# Patient Record
Sex: Female | Born: 1990 | Race: White | Hispanic: No | Marital: Single
Health system: Midwestern US, Academic
[De-identification: ages and names within clinical notes are randomized; demographics above are authoritative.]

---

## 2017-09-05 ENCOUNTER — Encounter: Admit: 2017-09-05 | Discharge: 2017-09-05

## 2017-09-05 ENCOUNTER — Encounter: Admit: 2017-09-05 | Discharge: 2017-09-06

## 2017-09-05 LAB — BLOOD GASES, PERIPHERAL VENOUS
Lab: 19 MMOL/L
Lab: 34 mmHg — ABNORMAL LOW (ref 36–50)
Lab: 5.6 MMOL/L
Lab: 65 mmHg — ABNORMAL HIGH (ref 33–48)
Lab: 7.3 (ref 7.30–7.40)
Lab: 93 % — ABNORMAL HIGH (ref 55–71)

## 2017-09-05 LAB — RVP VIRAL PANEL PCR: Lab: DETECTED

## 2017-09-05 LAB — POC GLUCOSE
Lab: 133 mg/dL — ABNORMAL HIGH (ref 70–100)
Lab: 143 mg/dL — ABNORMAL HIGH (ref 70–100)
Lab: 165 mg/dL — ABNORMAL HIGH (ref 70–100)
Lab: 168 mg/dL — ABNORMAL HIGH (ref 70–100)
Lab: 179 mg/dL — ABNORMAL HIGH (ref 70–100)
Lab: 224 mg/dL — ABNORMAL HIGH (ref 70–100)
Lab: 258 mg/dL — ABNORMAL HIGH (ref 70–100)
Lab: 265 mg/dL — ABNORMAL HIGH (ref 70–100)
Lab: 273 mg/dL — ABNORMAL HIGH (ref 70–100)
Lab: 331 mg/dL — ABNORMAL HIGH (ref 70–100)
Lab: 75 mg/dL (ref 70–100)

## 2017-09-05 LAB — PHENCYCLIDINES-URINE RANDOM: Lab: NEGATIVE

## 2017-09-05 LAB — BASIC METABOLIC PANEL
Lab: 133 MMOL/L — ABNORMAL LOW (ref 137–147)
Lab: 101 MMOL/L (ref 98–110)
Lab: 132 MMOL/L — ABNORMAL LOW (ref 137–147)
Lab: 4.1 MMOL/L (ref 3.5–5.1)

## 2017-09-05 LAB — BENZODIAZEPINES-URINE RANDOM: Lab: NEGATIVE

## 2017-09-05 LAB — PHOSPHORUS
Lab: 1.8 mg/dL — ABNORMAL LOW (ref 2.0–4.5)
Lab: 2.1 mg/dL (ref 2.0–4.5)

## 2017-09-05 LAB — BARBITURATES-URINE RANDOM: Lab: NEGATIVE

## 2017-09-05 LAB — CBC
Lab: 13 % (ref 11–15)
Lab: 13 g/dL (ref 12.0–15.0)
Lab: 232 10*3/uL (ref 150–400)
Lab: 30 pg (ref 26–34)
Lab: 33 g/dL (ref 32.0–36.0)
Lab: 4.4 M/UL (ref 4.0–5.0)
Lab: 40 % (ref 36–45)
Lab: 7.3 10*3/uL (ref 4.5–11.0)
Lab: 7.7 FL (ref 7–11)

## 2017-09-05 LAB — URINALYSIS DIPSTICK: Lab: 1 (ref 1.003–1.035)

## 2017-09-05 LAB — HEMOGLOBIN A1C: Lab: 18 % — ABNORMAL HIGH (ref 4.0–6.0)

## 2017-09-05 LAB — BETA HYDROXYBUTYRATE (KETONES): Lab: 3.3 MMOL/L — ABNORMAL HIGH (ref <0.3)

## 2017-09-05 LAB — COMPREHENSIVE METABOLIC PANEL
Lab: 0.3 mg/dL (ref 0.3–1.2)
Lab: 0.6 mg/dL (ref 0.4–1.00)
Lab: 107 MMOL/L (ref 98–110)
Lab: 138 MMOL/L (ref 137–147)
Lab: 15 mg/dL (ref 7–25)
Lab: 214 mg/dL — ABNORMAL HIGH (ref 70–100)
Lab: 25 U/L (ref 7–40)
Lab: 6.5 g/dL (ref 6.0–8.0)

## 2017-09-05 LAB — PROCALCITONIN: Lab: 0.3 ng/mL — ABNORMAL HIGH (ref <0.10)

## 2017-09-05 LAB — AMPHETAMINES-URINE RANDOM: Lab: NEGATIVE

## 2017-09-05 LAB — OPIATES-URINE RANDOM: Lab: NEGATIVE

## 2017-09-05 LAB — PREGNANCY TEST-URINE
Lab: 1
Lab: NEGATIVE

## 2017-09-05 LAB — CANNABINOIDS-URINE RANDOM: Lab: NEGATIVE

## 2017-09-05 LAB — MAGNESIUM
Lab: 1.8 mg/dL (ref 1.6–2.6)
Lab: 2.3 mg/dL (ref 1.6–2.6)

## 2017-09-05 LAB — GGTP: Lab: 19 U/L — ABNORMAL HIGH (ref <100)

## 2017-09-05 LAB — COCAINE-URINE RANDOM: Lab: NEGATIVE

## 2017-09-05 LAB — URINALYSIS, MICROSCOPIC

## 2017-09-05 LAB — TROPONIN-I: Lab: 0 ng/mL — ABNORMAL HIGH (ref 0.0–0.05)

## 2017-09-05 MED ORDER — INSULIN ASPART 100 UNIT/ML SC FLEXPEN
0-14 [IU] | Freq: Every day | SUBCUTANEOUS | 0 refills | Status: DC
Start: 2017-09-05 — End: 2017-09-06

## 2017-09-05 MED ORDER — MAGNESIUM SULFATE IN D5W 1 GRAM/100 ML IV PGBK
1 g | INTRAVENOUS | 0 refills | Status: CP
Start: 2017-09-05 — End: ?
  Administered 2017-09-05 (×3): 1 g via INTRAVENOUS

## 2017-09-05 MED ORDER — ONDANSETRON HCL (PF) 4 MG/2 ML IJ SOLN
4 mg | INTRAVENOUS | 0 refills | Status: DC | PRN
Start: 2017-09-05 — End: 2017-09-05
  Administered 2017-09-05: 14:00:00 4 mg via INTRAVENOUS

## 2017-09-05 MED ORDER — POTASSIUM PHOSPHATE IVPB-STD
12 MMOL | Freq: Once | INTRAVENOUS | 0 refills | Status: CP
Start: 2017-09-05 — End: ?
  Administered 2017-09-05 (×2): 12 mmol via INTRAVENOUS

## 2017-09-05 MED ORDER — DEXTROSE 5 %-LACTATED RINGERS IV SOLP
1000 mL | INTRAVENOUS | 0 refills | Status: DC
Start: 2017-09-05 — End: 2017-09-05
  Administered 2017-09-05: 14:00:00 1000 mL via INTRAVENOUS

## 2017-09-05 MED ORDER — INSULIN 100UNITS NS 100ML
.5 [IU]/h | INTRAVENOUS | 0 refills | Status: AC
Start: 2017-09-05 — End: ?
  Administered 2017-09-05 (×2): 2.5 [IU]/h via INTRAVENOUS

## 2017-09-05 MED ORDER — LACTATED RINGERS IV SOLP
1000 mL | INTRAVENOUS | 0 refills | Status: DC
Start: 2017-09-05 — End: 2017-09-05

## 2017-09-05 MED ORDER — FAMOTIDINE 20 MG PO TAB
20 mg | Freq: Every day | ORAL | 0 refills | Status: DC
Start: 2017-09-05 — End: 2017-09-07
  Administered 2017-09-05 – 2017-09-06 (×2): 20 mg via ORAL

## 2017-09-05 MED ORDER — PROCHLORPERAZINE EDISYLATE 5 MG/ML IJ SOLN
5-10 mg | INTRAVENOUS | 0 refills | Status: DC | PRN
Start: 2017-09-05 — End: 2017-09-07
  Administered 2017-09-05 (×2): 5 mg via INTRAVENOUS
  Administered 2017-09-06 (×2): 10 mg via INTRAVENOUS

## 2017-09-05 MED ORDER — OSELTAMIVIR 75 MG PO CAP
75 mg | Freq: Two times a day (BID) | ORAL | 0 refills | Status: DC
Start: 2017-09-05 — End: 2017-09-06
  Administered 2017-09-05 – 2017-09-06 (×3): 75 mg via ORAL

## 2017-09-05 MED ORDER — ENOXAPARIN 40 MG/0.4 ML SC SYRG
40 mg | Freq: Every day | SUBCUTANEOUS | 0 refills | Status: DC
Start: 2017-09-05 — End: 2017-09-07
  Administered 2017-09-07: 03:00:00 40 mg via SUBCUTANEOUS

## 2017-09-05 MED ORDER — INSULIN GLARGINE 100 UNIT/ML (3 ML) SC INJ PEN
14 [IU] | Freq: Every evening | SUBCUTANEOUS | 0 refills | Status: DC
Start: 2017-09-05 — End: 2017-09-07
  Administered 2017-09-06: 04:00:00 14 [IU] via SUBCUTANEOUS

## 2017-09-05 NOTE — Progress Notes
0730 - Assumed pt. care at this time, bedside safety check performed, and plan of care reviewed. No gtts currently infusing, MICU team paged for clarification on orders.     0800 - Assessment complete, see ICU flowsheet for details. VS per pt. trends. Will continue to monitor.     0820 - Insulin and D5 LR started at ordered rates per Coppage, APRN. [facility].     [id number] - Orders from Coppage, APRN to not send scheduled 0845 BMP and will redraw at 1200.     1200 - Reassessment complete and no significant changes from previous assessment. Will continue to monitor.     1515 - Pt. report given to receiving RN at this time.

## 2017-09-05 NOTE — Progress Notes
ED-1.27m   C/O-N/V/D/Dizzy/Abd Pain  No insulin today    BP-122/81  T-97.4  128-ST  RR-24  SpO2-98% RA    PMH: Type 1 DM-non compliant; s/p code last admission    FSBS-693 on arrival-->528 (10U IV Insulin) 1L NS  WBC-10.1  Hgb-15.6  Hct-49.3  Plt-246  Lactate-2.3    ABG: 7.29/6/109/8    Na+134  K+4.7  Cl-97  CO2-6  GAP-36  BUN-16  1.28-Creat  12.8-beta hydroxybutyrate  Ca+9.9  T-Bili-0.4  AST-30  ALT-30  Alk Phos-193  Amonia-36  ETOH (-)    Urine--> ordered (has not voided)    CXR-->(-)    Starting insulin gtt  Administering additional 1L IVF    1 dose Zofran/Phenergan-has helped with nausea

## 2017-09-05 NOTE — H&P (View-Only)
Critical Care   Admission History and Physical Assessment       Name:  Kristen Raymond                                             MRN:  [id number]   Admission Date:  09/05/2017    Active Problems:    DKA (diabetic ketoacidoses) (HCC)    Tachycardia    Elevated alkaline phosphatase level    Influenza A (H1N1)                     Assessment and Plan     NEURO:   - hold PTA: ativan 0.5mg  q12 prn  - hold PTA: oxy IR 5mg  q4 prn    CV:  Tachycardia  - suspect d/t DKA  - EKG -   - trop - 0.02  - HR 110s & SBP 110s    PULM:   - RA    GI:   - continue PTA: pepcid  Elevated ALP  - has been elevated since 09/2015  - ALP 142  - AST/ALT/Tbili - WNL  - GGTP - 19  - monitor    RENAL:   - Cr 0.67    ENDO:   DKA  - at OSH: BG 693, 7.29/6//8, AG 36, BHB 12.8 received 2L IVF + 10u insulin sq  - on admit to [place]: AG 13, BG 214, BHB 3.3 & CO2 18  - pt endorses noncompliance w/ insulin  - UDS - Neg  - beta HCG - Neg  - a1c 12/14 - 18.1  - hold PTA: Lantus 14u HS, novolog 4u tid + novolog correction factor  - insulin gtt + D5/LR at 200cc/hr  - AG close ~1300 --> will continue insulin drip until Lantus is given tonight & add MDCF then as well  - start diabetic diet  - dietician consult  - diabetes RN educator consult    ID:   Influenza A Infection  - WBC 7.3; afebrile  - CXR 12/14 (OSH) - no infiltrate  - BC 12/14 - ordered  - UA 12/14 - bland  - UC 12/14 - ordered  - PCT 12/14 - 0.36  - RVP 12/14 +infleunza A H1N1  - no abx  - will give 5d course of tamiflu    Prophylaxis Review:  Lines: PIV  Tubes: none  IVF: S/L  Electrolytes: Reviewed and replaced as needed.   Diet: Yes  Insulin: Yes  Urinary Catheter: No  VTE ppx: Lovenox; SCDs  GI ppx: no indication  PT/OT: Yes    Code status: Full Code   Disposition: Admitted to the MICU in transfer from an OSH.     Jarrett Ables, APRN   Pulm/Critical Care  Pager [phone]   09/05/2017    M2 team pager (2nd call/nights) [phone] ___________________________________________________________________________  Primary Care Physician: Lavone Orn      CHIEF COMPLAINT:  DKA    HISTORY OF PRESENT ILLNESS: Kristen Raymond is a 26 y.o. female with a PMH of: DM1. She presented to [facility] w/ findings of DKA. There labs showed: BG 693, 7.29/6//8, AG 36, BHB 12.8. She received 2L IVF + 10u insulin sq prior to being tx'd to [place].    PMH:  Past Medical History:   Diagnosis Date   ??? DM (diabetes mellitus) (HCC)     Type 1  PSH:  No past surgical history on file.     SOCIAL HISTORY:  Social History     Socioeconomic History   ??? Marital status: Single     Spouse name: Not on file   ??? Number of children: Not on file   ??? Years of education: Not on file   ??? Highest education level: Not on file   Social Needs   ??? Financial resource strain: Not on file   ??? Food insecurity - worry: Not on file   ??? Food insecurity - inability: Not on file   ??? Transportation needs - medical: Not on file   ??? Transportation needs - non-medical: Not on file   Occupational History   ??? Not on file   Tobacco Use   ??? Smoking status: Former Smoker     Types: Cigarettes     Last attempt to quit: 05/29/2015     Years since quitting: 2.2   Substance and Sexual Activity   ??? Alcohol use: No   ??? Drug use: Yes     Types: Marijuana     Comment: States she stopped using the day before admission (08/28/15).  States she was using once daily in the morning.   ??? Sexual activity: Not on file   Other Topics Concern   ??? Not on file   Social History Narrative   ??? Not on file        FAMILY HISTORY:  Family History   Problem Relation Age of Onset   ??? Depression Mother    ??? Diabetes Mother    ??? Depression Maternal Grandmother    ??? Diabetes Maternal Grandmother    ??? Diabetes Maternal Grandfather    ??? Diabetes Paternal Grandmother    ??? Diabetes Paternal Grandfather    ??? Cancer Other         great great grandmother   ??? Arthritis Other         great grandparents   ??? Stroke Other great grandma   ??? Cancer-Breast Neg Hx    ??? Cervical Cancer Neg Hx    ??? Cancer-Colon Neg Hx    ??? Cancer-Ovarian Neg Hx    ??? Cancer-Uterine Neg Hx    ??? Anesthetic Complication Neg Hx    ??? Bleeding Disorders Neg Hx    ??? Cleft Lip Neg Hx    ??? Cleft Palate Neg Hx    ??? Heart Disease Neg Hx    ??? High Cholesterol Neg Hx    ??? Hypertension Neg Hx    ??? Migraines Neg Hx    ??? Miscarriages Neg Hx    ??? Rashes/Skin Problems Neg Hx    ??? Seizures Neg Hx    ??? Thyroid Disease Neg Hx    ??? Asthma Maternal Aunt    ??? Diabetes Maternal Aunt         IMMUNIZATIONS:   Immunization History   Administered Date(s) Administered   ??? Flu Vaccine Quadrivalent =>3 Yo (Preservative Free) 06/28/2015   ??? Tdap Vaccine 10/27/2015           ALLERGIES:  Pcn [penicillins] and Vancomycin    HOME MEDICATIONS:  Medications Prior to Admission   Medication Sig   ??? Acetone (Urine) Test strp Use 1 Strip as directed as Needed (Please check urine ketones with elevated or low blood sugars). Indications: DIAGNOSTIC TEST FOR KETONURIA   ??? famotidine (PEPCID) 20 mg tablet Take 1 Tab by mouth daily.   ??? insulin aspart (NOVOLOG FLEXPEN)  100 unit/mL injection PEN Inject 4 Units under the skin three times daily with meals.   ??? insulin aspart (NOVOLOG FLEXPEN) 100 unit/mL injection PEN Inject 0-7 Units under the skin five times daily.   ??? insulin glargine (LANTUS SOLOSTAR) 100 unit/mL (3 mL) injection PEN Inject 11 Units under the skin at bedtime daily. (Patient taking differently: Inject 14 Units under the skin at bedtime daily.)   ??? insulin pen needles (disposable) (BD UF NANO PEN NEEDLES) 32 gauge x 5/32 pen needle Use 1 Each as directed as Needed. Use with insulin injections.   ??? Insulin Syringe-Needle U-100 1 mL 31 gauge x 5/16 syrg Use daily when injecting insulin.   ??? lanolin (LAN-O-SOOTHE) topical cream Apply  topically to affected area as Needed.   ??? LORazepam (ATIVAN) 0.5 mg tablet Take 1 Tab by mouth every 12 hours as needed for Nausea. Indications: ANXIETY ??? oxyCODONE (ROXICODONE, OXY-IR) 5 mg tablet Take 1 Tab by mouth every 4 hours as needed Earliest Fill Date: 12/01/15   ??? scopolamine (TRANSDERM-SCOP) 1.5 mg 3 day patch Apply 1 Patch to top of skin as directed every 72 hours.        Vital Signs:  Vitals:    09/05/17 0900 09/05/17 1000 09/05/17 1100 09/05/17 1200   BP: 117/83 114/84 117/80 120/90   Pulse: 114 116 111 114   Temp:       SpO2: 100% 99% 100% 99%       ROS:    Neg: CP, C/D, rash, dyspnea, increased WOB, cough, HA, pain  Pos: nausea, sore throat, fever 1 or 2 days ago    Physical Exam:  General:  Alert, cooperative, no distress, appears stated age  Head:  Normocephalic, without obvious abnormality, atraumatic  Eyes:  Conjunctivae/corneas clear  Mouth/throat: Moist mucous membranes  Neck:  Supple, symmetrical, trachea midline  Lungs:  Clear to auscultation bilaterally; no wheeze  Heart:  Regular rate and rhythm, S1, S2 normal, no murmur appreciated  Abdomen:  Soft, non-tender.  Bowel sounds normal.  Extremities:  Extremities normal, atraumatic, no cyanosis or edema  Pulses:   2+ and symmetric, all extremities  Skin:   No rashes or lesions noted  Neurologic:  A&O    Laboratory:  Recent Labs      09/05/17   0550  09/05/17   1205   NA  138  133*   K  3.9  3.8   CL  107  105   CO2  18*  21   GAP  13*  7   BUN  15  10   CR  0.67  0.53   GLU  214*  244*   CA  8.6  7.9*   ALBUMIN  3.9   --    MG  1.8   --    PO4  1.8*   --    HGBA1C  18.1*   --        Recent Labs      09/05/17   0550   WBC  7.3   HGB  13.5   HCT  40.6   PLTCT  232   AST  25   ALT  24   ALKPHOS  142*   TNI  0.02      CrCl cannot be calculated (Unknown ideal weight.).There were no vitals filed for this visit. No results for input(s): PHART, PO2ART in the last 72 hours.    Invalid input(s): PC02A  Radiology and Other Diagnostic Procedures Review:    Reviewed

## 2017-09-05 NOTE — Case Management (ED)
Case Management Admission Assessment    NAME:Kristen Raymond                          MRN: [id number]             DOB:[birth year]          AGE: 26 y.o.  ADMISSION DATE: 09/05/2017             DAYS ADMITTED: LOS: 0 days      Today???s Date: 09/05/2017    Source of Information: Patient refused to talk but gave permission to call and talk with her Sister. Spoke with Kristen Raymond via phone.       Plan  Plan: Case Management Assessment, Assist PRN with SW/NCM Services, Discharge Planning for Home Anticipated     ??? Infectious work up  ??? Consult PT/OT  ??? Consult Diabetes Nurse Educator  ??? DC planning on going  ??? Wean Insulin gtt as tolerated.    Interventions  ? Support    Patient lives at home with her 4 month old Son. Patient has family support and Son is currently with her Aunt. Spoke with patient's Sister, Kristen Raymond who is a Charity fundraiser, she is very concerned with patient's non-compliance. She hasn't been to a doctor in over a year and doesn't check her blood sugars. She take just enough Insulin to get by. Sister is worried that patient may be Bipolar as it runs in the family. 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 Sister stated that she has thought about getting DCF involved if patient continues to ignore her health needs. Informed Team.  ? Info or Referral    Left [facility] information at bedside for Sister at her request.  ? Discharge Planning      ? Medication Needs      ? Financial      ? Legal      ? Other        Disposition  ? Expected Discharge Date       ? Transportation   Does the patient need discharge transport arranged?: No  Transportation Name, Phone and Availability #1: Kristen Raymond [phone]  Does the patient use Medicaid Transportation?: No  ? Next Level of Care (Acute Psych discharges only)      ? Discharge Disposition                                          Durable Medical Equipment      No service has been selected for the patient.      [place] Destination No service has been selected for the patient.      [facility]      No service has been selected for the patient.      [facility]      No service has been selected for the patient.            Patient Address/Phone  [address]  [place] [address]  [phone] (home) [phone] (work)    Science writer  Extended Emergency Contact Information  Primary Emergency Contact: Kristen Raymond,Kristen Raymond  Address: [address]           [place], [place] [address] [place]  Home Phone: [phone]  Relation: Mother  Secondary Emergency Contact: Kristen Raymond, Kristen Raymond [place]  Home Phone: [phone]  Relation:  Sister    Systems developer  Does the patient need discharge transport arranged?: No  Transportation Name, Phone and Availability #1: Kristen Raymond [phone]  Does the patient use Medicaid Transportation?: No    Expected Discharge Date       Living Situation Prior to Admission  ? Living Arrangements  Type of Residence: Home, independent  Living Arrangements: Children  Financial risk analyst / Tub: Tub/Shower Unit  How many levels in the residence?: 1  Can patient live on one level if needed?: N/A  Does residence have entry and/or side stairs?: Yes  Assistance needed prior to admit or anticipated on discharge: No  Who provides assistance or could if needed?: Sister and Aunt  Are they in good health?: Yes  Can support system provide 24/7 care if needed?: Maybe  ? Level of Function   Prior level of function: Independent  ? Cognitive Abilities   Cognitive Abilities: Continue to Assess    Financial Resources  ? Coverage  Primary Insurance: No insurance  Secondary Insurance: No insurance  Additional Coverage: None    ? Source of Income      ? Financial Assistance Needed?      Psychosocial Needs  ? Mental Health  Mental Health History: No  ? Substance Use History     ? Other      Current/Previous Services  ? PCP  Lavone Orn, [phone], [phone]  ? Pharmacy [facility] [phone] - [place] [place] - [address]  [address] [place]  [place] [address]  Phone: [phone] Fax: [phone]    [facility])  [phone] [address]  [place] [address]  Phone: [phone] Fax: [phone]    ? Durable Medical Equipment   Durable Medical Equipment at home: (Denies)  ? Home Health  Receiving home health: No  ? Hemodialysis or Peritoneal Dialysis  Undergoing hemodialysis or peritoneal dialysis: No  ? Tube/Enteral Feeds  Receive tube/enteral feeds: No  ? Infusion  Receive infusions: No  ? Private Duty  Private duty help used: No  ? Home and Community Based Services  Home and community based services: No  ? [employer] Supply: N/A  ? Hospice  Hospice: No  ? Outpatient Therapy  PT: No  OT: No  SLP: No  ? Skilled Nursing Facility/Nursing Home  SNF: No  NH: No  ? Inpatient Rehab  IPR: No  ? Long-Term Acute Care Hospital  LTACH: No  ? Acute Hospital Stay  Acute Hospital Stay: In the past  Was patient's stay within the last 30 days?: No  When did patient receive care?: 11/2015    Lenon Ahmadi RN, BSN  Nurse Case Manager  [phone] or [phone]

## 2017-09-05 NOTE — Consults
INPATIENT DIABETES EDUCATION TEAM [facility]    Reason for Consult: DKA    Attempted to see pt. Pt sleeping, unable to wake up to have a meaningful conversation with educator. Pt was able to tell this educator that she takes Humalog insulin and that she [place] it [place] [facility] [place]. Pt unable to tell me if she has been taking insulin prior to admission.     Per chart review, pt lives with T1 diabetes, dx at age 26. In 2016, HgbA1c 7.7%. HgbA1c today is 18.1% and admitted with DKA which demonstrates that pt may not have been taking insulin PTA.     Pt on insulin gtt. Pt does not have insurance coverage. If d/c over the weekend, may need assistance with insulin.     Will follow up.        Appreciate consult on this patient.    If any further needs please consult diabetic nurse educators (Team pager [phone])    Kennon RoundsKidist January Bergthold, RN, MSN, CMSRN, CDE  Pager: [phone] 08:00-4:30 weekdays, If no response, please call team pager [phone])  Office: [phone]  Diabetic Education Team office [phone])

## 2017-09-06 LAB — POC GLUCOSE
Lab: 177 mg/dL — ABNORMAL HIGH (ref 70–100)
Lab: 184 mg/dL — ABNORMAL HIGH (ref >60)
Lab: 200 mg/dL — ABNORMAL HIGH (ref 70–100)
Lab: 142 mg/dL — ABNORMAL HIGH (ref 70–100)
Lab: 130 mg/dL — ABNORMAL HIGH (ref 70–100)
Lab: 218 mg/dL — ABNORMAL HIGH (ref 70–100)
Lab: 229 mg/dL — ABNORMAL HIGH (ref 70–100)
Lab: 87 mg/dL (ref 70–100)

## 2017-09-06 LAB — BASIC METABOLIC PANEL
Lab: 103 MMOL/L — ABNORMAL HIGH (ref 98–110)
Lab: 134 MMOL/L — ABNORMAL LOW (ref 137–147)
Lab: 24 MMOL/L (ref 21–30)
Lab: 3.2 MMOL/L — ABNORMAL LOW (ref 3.5–5.1)

## 2017-09-06 LAB — CBC: Lab: 5.4 K/UL — ABNORMAL LOW (ref >60)

## 2017-09-06 LAB — PHOSPHORUS
Lab: 1.5 mg/dL — ABNORMAL LOW (ref >60)
Lab: 1.7 mg/dL — ABNORMAL LOW (ref 2.0–4.5)

## 2017-09-06 LAB — COMPREHENSIVE METABOLIC PANEL: Lab: 134 MMOL/L — ABNORMAL LOW (ref >60)

## 2017-09-06 LAB — CULTURE-URINE W/SENSITIVITY: Lab: 3 pg (ref 26–34)

## 2017-09-06 LAB — MAGNESIUM
Lab: 2 mg/dL — ABNORMAL HIGH (ref >60)
Lab: 1.7 mg/dL — ABNORMAL HIGH (ref 1.6–2.6)

## 2017-09-06 MED ORDER — POTASSIUM PHOSPHATE IVPB-STD
16 MMOL | Freq: Once | INTRAVENOUS | 0 refills | Status: CP
Start: 2017-09-06 — End: ?
  Administered 2017-09-06 (×2): 16 mmol via INTRAVENOUS

## 2017-09-06 MED ORDER — POTASSIUM PHOSPHATE IVPB-STD
16 MMOL | Freq: Once | INTRAVENOUS | 0 refills | Status: CP
Start: 2017-09-06 — End: ?
  Administered 2017-09-07 (×2): 16 mmol via INTRAVENOUS

## 2017-09-06 MED ORDER — MAGNESIUM SULFATE IN D5W 1 GRAM/100 ML IV PGBK
1 g | INTRAVENOUS | 0 refills | Status: CP
Start: 2017-09-06 — End: ?
  Administered 2017-09-06 – 2017-09-07 (×3): 1 g via INTRAVENOUS

## 2017-09-06 MED ORDER — INSULIN ASPART 100 UNIT/ML SC FLEXPEN
4 [IU] | Freq: Three times a day (TID) | SUBCUTANEOUS | 0 refills | Status: DC
Start: 2017-09-06 — End: 2017-09-07

## 2017-09-06 MED ORDER — POTASSIUM CHLORIDE 20 MEQ PO TBTQ
40 meq | Freq: Once | ORAL | 0 refills | Status: CP
Start: 2017-09-06 — End: ?
  Administered 2017-09-06: 23:00:00 40 meq via ORAL

## 2017-09-06 MED ORDER — INSULIN ASPART 100 UNIT/ML SC FLEXPEN
0-7 [IU] | Freq: Every day | SUBCUTANEOUS | 0 refills | Status: DC
Start: 2017-09-06 — End: 2017-09-07

## 2017-09-06 NOTE — Progress Notes
0800 Assumed pt care at this time. Bedside safety check performed, plan of care reviewed. Physical assessment complete, please see ICU flowsheet for details. VSS. Will continue to monitor.

## 2017-09-06 NOTE — Consults
INPATIENT DIABETES EDUCATION TEAM ???[facility]    Reason for Consult: DKA    Patient may benefit from: social work consult for coverage assessment; on-going outpatient psychotherapy; motivational interviewing    This consult team does not write orders. Primary Team is responsible for placing orders.    Met with Kristen Raymond to discuss diabetes management. Pt familiar to this education team, last seen 11/2015. I have seen this patient previously, and patient remembers me from previous encounter in 2016 once I reintroduced myself and role.  Pt presented to OSH with DKA. See detailed encounter below.     Diabetes disease process: Pt living with Type 1 Diabetes since age 26.     Monitoring and glucose goals: Pt does not have a meter at home, so has not been checking blood sugars. Discussed affordable meters including Glucocard Expression Kit (from [employer]) and Relion Prime meter from [employer]. Pt reports she will purchase a Relion meter from [employer] ($9). Discussed pricing of strips ($9/50). Discussed briefly option of CGM if patient were to obtain coverage (Freestyle, Dexcom, Guardian). Pt would be interested in this eventually if she could obtain coverage.     Glucose levels: Discussed goals of 70-140 prior to meals, or up to 180 random glucose level.     Medication: Discussed PTA regimen. Pt reports she takes Humalog, but does not have a set dose. Pt states I just guess. Pt reports she does not indeed take any basal insulin (Lantus, Toujeo, etc). Discussed this with patient. Pt states she hasn't taken any Lantus in close to a year. Attempt to assess reasoning, patient just states I'm a bad diabetic. Pt reports she does have Humalog and Lantus at home and is willing to restart taking it. Pt reports she gets her insulin from the health clinic in [place] without any issues. Discussed importance of Lantus and Humalog in r/t pathophysiology of Type 1 diabetes. Hypoglycemia s/s, definition, prevention and treatment, rule of 15. Pt reports she has not had a low blood sugar in forever.     Hyperglycemia s/s, definition, prevention and treatment. Reviewed meaning of HbA1c, current A1c 18.1%, demonstrating poor glycemic control.  Eventual goal of < 7%. Reviewed long term complications.    Sick day management: Encouraged to stay hydrated, take medications as ordered, and check glucose more frequently. Also, to contact PCP, urgent care, or ED when appropriate. Provided handout.     Provider: Who to contact for abnormal results or emergencies, follow up for diabetes mgmt: Instructed to call their diabetes provider for glucose consistently above 250 or below 70 after treatment. Pt follows with PCP in [place]. Patient states she hasn't seen a doctor in a long time, but used to go monthly. Pt reports she feels she does better and holds herself more accountable when she sees a provider every month.     Social: Of note, patient opened about her struggles with being a single mom and trying to manage her diabetes. Pt reports her 25 month old child's dad is no longer in the picture at all. Pt's child is staying with her sister currently as she's in the hospital. Pt states on multiple occasions that she is just a bad diabetic. Provided positive reinforcement and therapeutic communication and presence. Provided encouragement for patient. Discussed internal motivators--patient was able to name her child and some family. Discussed importance of taking care of herself as well. Discussed concept of self love and self care. Patient became tearful throughout encounter--continued to provide encouragement.  Pt states she is willing to take her Lantus and do better in order to take better care of herself and her child. We discussed diabetes burnout and healthy coping mechanisms to overcome barriers and stressors.     Appreciate the consult. Please contact diabetes educators with any additional questions or concerns.     Perley Jain, BSN, BS, RN, CPT  Diabetes Educator  Office: [phone]  Pager: [phone]  Diabetes Education Team Pager: [phone]  Diabetes Team Office: [phone]

## 2017-09-06 NOTE — Progress Notes
This RN encouraged pt to order dinner. Pt "not hungry" and wanting chicken broth, jelly, and sherbet later tonight. This RN educated pt on importance of appropriate nutrition. Pt verbalized understanding.

## 2017-09-06 NOTE — Consults
CLINICAL NUTRITION                                                        Clinical Nutrition Assessment Summary     NAME:Kristen Raymond             MRN: [id number]             DOB:01/18/1991          AGE: 26 y.o.  ADMISSION DATE: 09/05/2017             DAYS ADMITTED: LOS: 1 day    Nutrition Assessment of Patient:  BMI Categories Adult: Acceptable: 18.5-24.9  Malnutrition Assessment: Does not meet criteria  Current Oral Intake: Marginally Adequate  Estimated Calorie Needs: 1400-1500(28-30 kcal/kg present wt 50.2kg)  Estimated Protein Needs: 50-60(1-1.2 g/kg present wt)  Oral Diet Order: Diabetic 1600-2000 Kcal/day (60 g Carb/meal, 30 g Carb/HS snack)       Comments:  Kristen Raymond is a 26 y.o. female with a PMH of: DM1. She [facility] w/ findings of DKA. Their labs showed: BG 693. A1c 18.1 mg/dL. Pt sleeping at time of visit with friends at bedside. She woke briefly to name. I inquired about her discussion with diabetic educator and if she would like to talk about carbohydrates further. Pt declined and states she doesn't need any further education. She was happy to take Simple Carb Counting handout with RD contact information. Note progress notes show pt with poor appetite. Wt is down ~40 lb in the last ~2 years. Unable to meet malnutrition criteria at this time. Denies n/v/d/c. No pressure injuries or edema. Will leave pt at NR, re-eval shortly to offer further diabetic diet education and monitor PO intakes/weight closely.    Recommendation:  Continue current diet as ordered.                                Shirlee LimerickEmma Jannetta Massey, RD, LD  [phone]  [phone]

## 2017-09-06 NOTE — Progress Notes
1600 Assumed pt care at this time. Bedside safety check performed, plan of care reviewed. Physical assessment complete, please see ICU flowsheet for details. VSS. Will continue to monitor.

## 2017-09-06 NOTE — Progress Notes
09/05/2017  1930: Assumed patient care at this time; report received and bedside safety checks performed with previous RN, Bree. Insulin gtt verified per eMAR. Patient lethargic but responsive to RN questions, asking for water and to sleep. Fall Bundle not indicated at this time. Current orders, plans of care, lab values, telemetry reviewed. Monitoring.    ~2000: Assessment complete per ICU flowsheet; VSS per trends on RA, PIVs patent and without complication, patient denies needs. Sister, Adelina MingsKelsey, at bedside and updated on plans of care per request. Dr. Burton Apleyhia rounding on unit and patient care discussed. Monitoring.    2200: Novolin infusion discontinued and Lantus administered per orders. Patient education provided.    09/06/2017  ~0000: Reassessment complete per ICU flowsheet; no significant changes noted. VSS per trends on RA. Patient denies needs. PIVs without complication. Monitoring.    ~0400: Reassessment complete per ICU flowsheet; no significant changes noted. VSS per trends on RA. Patient denies needs. PIVs without complication. Monitoring.

## 2017-09-06 NOTE — Progress Notes
Critical Care   Progress Note     Kristen Raymond  Today's Date:  09/06/2017  Admission Date: 09/05/2017  LOS: 1 day    Principal Problem:    DKA (diabetic ketoacidoses) (HCC)  Active Problems:    Tachycardia    Elevated alkaline phosphatase level    Influenza A (H1N1)        Assessment/Plan:      Hospital Course:  Kristen Raymond is a 26 y.o. female with a PMH of: DM1. She presented to [facility] for DKA who then tx'd her to [place] for further management. DKA managed w/ IVF & insulin gtt; now back on home insulin regimen. Incidentally found to be flu positive. Is having a fair amount of nausea and eating minimally. Will stop tamiflu and plan to monitor in the hospital today.     of DKA.   NEURO:   - hold PTA: ativan 0.5mg  q12 prn  - hold PTA: oxy IR 5mg  q4 prn    CV:  Tachycardia (improved)  - d/t DKA  - EKG - no ST changes  - trop - 0.02  - HR 110s & SBP 110s  ???  PULM:   - RA  ???  GI:   - continue PTA: pepcid  Elevated ALP  - has been elevated since 09/2015  - ALP 142 -> 115  - AST/ALT/Tbili - WNL  - GGTP - 19  - monitor  Nausea  - d/t DM1 vs tamiflu  - compazine prn  - DC tamiflu as below  ???  RENAL:   - Cr 0.46  - IO: 2.8 / 2  ???  ENDO:   DKA (resolved)  DM1  - d/t noncompliance (endorsed by pt)  - at OSH: BG 693, 7.29/6//8, AG 36, BHB 12.8 received 2L IVF + 10u insulin sq  - on admit to [place]: AG 13, BG 214, BHB 3.3 & CO2 18  - UDS - Neg  - beta HCG - Neg  - a1c 12/14 - 18.1  - diabetic diet --> though has not eaten yet  - continue PTA Lantus 14u hs  - BG 200, 142 (since resuming Lantus)  - resume PTA novolog 4u tid + LDCF  - dietician consult  - diabetes RN educator consult    ID:   Influenza A Infection  - WBC 5.4; afebrile  - CX 12/14 (OSH) - no infiltrate  - BC 12/14 - NGTD  - UA 12/14 - bland  - PCT 12/14 - 0.36  - RVP 12/14 +infleunza A H1N1  - no abx  - tamiflu --> pt is asymptomatic and with persistent nausea will DC tamiflu  ???  Prophylaxis Review:  Lines: PIV Tubes: noneR  IVF: S/L  Electrolytes: Reviewed and replaced as needed.   Diet: Yes  Insulin: Yes  Urinary Catheter: No  VTE ppx: Lovenox; SCDs  GI ppx: no indication  PT/OT: Yes  ???  Code status: Full Code   Disposition: Stable to tx to the floor, anticipate will DC home tomorrow.    Jarrett Ables, APRN   Pulm/Critical Care  Pager [phone]   09/06/2017    M2 team pager (2nd call/nights) [phone]  __________________________________________________________________________________    Subjective:        Kristen Raymond is a 26 y.o. female who is resting in bed this AM and continues to have intermittent nausea and poor appetite.     ROS: Denies: CP, pain, SOB, cough, C/D and rash.    Objective:  Medications:  Scheduled Meds:  enoxaparin (LOVENOX) syringe 40 mg 40 mg Subcutaneous QDAY(21)   famotidine (PEPCID) tablet 20 mg 20 mg Oral QDAY   insulin aspart U-100 (NOVOLOG FLEXPEN) injection PEN 0-14 Units 0-14 Units Subcutaneous 5 X Day   insulin glargine (LANTUS SOLOSTAR, BASAGLAR) injection PEN 14 Units 14 Units Subcutaneous QHS(22)   oseltamivir (TAMIFLU) capsule 75 mg 75 mg Oral BID   potassium phosphate 16 mmol in dextrose 5% (D5W) 250 mL IVPB (std) 16 mmol Intravenous ONCE   Continuous Infusions:  PRN and Respiratory Meds:prochlorperazine Q6H PRN                       Vital Signs: Last Filed                  Vital Signs: 24 Hour Range   BP: 109/72 (12/15 0600)  Temp: 36.7 ???C (98 ???F) (12/15 0400)  Pulse: 108 (12/15 0600)  Respirations: 29 PER MINUTE (12/15 0600)  SpO2: 97 % (12/15 0600)  O2 Delivery: None (Room Air) (12/15 0700)  SpO2 Pulse: 108 (12/15 0600)  Height: 157.5 cm (62) (12/15 0000) BP: (97-127)/(63-90)   Temp:  [36.6 ???C (97.9 ???F)-36.7 ???C (98.1 ???F)]   Pulse:  [108-121]   Respirations:  [22 PER MINUTE-30 PER MINUTE]   SpO2:  [94 %-100 %]   O2 Delivery: None (Room Air)     Vitals:    09/06/17 0000   Weight: 50.2 kg (110 lb 10.7 oz)           Intake/Output Summary:  (Last 24 hours) Intake/Output Summary (Last 24 hours) at 09/06/2017 2956  Last data filed at 09/06/2017 0000  Gross per 24 hour   Intake 2815.28 ml   Output 1950 ml   Net 865.28 ml         Physical Exam:         General:  Alert, cooperative, no distress, appears stated age  Head:  Normocephalic, without obvious abnormality, atraumatic  Eyes:  Conjunctivae/corneas clear  Mouth/throat: Moist mucous membranes  Neck:  Supple, symmetrical, trachea midline  Lungs:  Clear to auscultation bilaterally; no wheeze  Heart:  Regular rate and rhythm, S1, S2 normal, no murmur appreciated  Abdomen:  Soft, non-tender.  Bowel sounds normal.  Extremities:  Extremities normal, atraumatic, no cyanosis or edema  Pulses:   2+ and symmetric, all extremities  Skin:   No rashes or lesions noted  Neurologic:  A&O    Laboratory:  LABS:  Recent Labs      09/05/17   0550  09/05/17   1205  09/05/17   1648  09/06/17   0310   NA  138  133*  132*  134*   K  3.9  3.8  4.1  3.6   CL  107  105  101  102   CO2  18*  21  18*  24   GAP  13*  7  13*  8   BUN  15  10  8  7    CR  0.67  0.53  0.49  0.46   GLU  214*  244*  196*  169*   CA  8.6  7.9*  8.0*  7.9*   ALBUMIN  3.9   --    --   3.3*   MG  1.8   --   2.3  2.0   PO4  1.8*   --   2.1  1.5*   HGBA1C  18.1*   --    --    --        Recent Labs      09/05/17   0550  09/06/17   0310   WBC  7.3  5.4   HGB  13.5  13.5   HCT  40.6  39.7   PLTCT  232  195   AST  25  27   ALT  24  17   ALKPHOS  142*  115*   TNI  0.02   --       Estimated Creatinine Clearance: 94.8 mL/min (based on SCr of 0.46 mg/dL).  Vitals:    09/06/17 0000   Weight: 50.2 kg (110 lb 10.7 oz)    No results for input(s): PHART, PO2ART in the last 72 hours.    Invalid input(s): PC02A        Radiology and Other Diagnostic Procedures Review:    Reviewed

## 2017-09-07 ENCOUNTER — Inpatient Hospital Stay
Admit: 2017-09-05 | Discharge: 2017-09-07 | Disposition: A | Discharge status: Home / Self Care | Source: Other Acute Inpatient Hospital | Attending: Pulmonary Disease

## 2017-09-07 ENCOUNTER — Encounter: Admit: 2017-09-07 | Discharge: 2017-09-07

## 2017-09-07 DIAGNOSIS — R000 Tachycardia, unspecified: ICD-10-CM

## 2017-09-07 DIAGNOSIS — E1010 Type 1 diabetes mellitus with ketoacidosis without coma: Principal | ICD-10-CM

## 2017-09-07 DIAGNOSIS — Z87891 Personal history of nicotine dependence: ICD-10-CM

## 2017-09-07 DIAGNOSIS — Z794 Long term (current) use of insulin: ICD-10-CM

## 2017-09-07 DIAGNOSIS — R748 Abnormal levels of other serum enzymes: ICD-10-CM

## 2017-09-07 DIAGNOSIS — T383X6A Underdosing of insulin and oral hypoglycemic [antidiabetic] drugs, initial encounter: ICD-10-CM

## 2017-09-07 DIAGNOSIS — J101 Influenza due to other identified influenza virus with other respiratory manifestations: Secondary | ICD-10-CM

## 2017-09-07 LAB — PHOSPHORUS: Lab: 2.3 mg/dL — ABNORMAL LOW (ref 2.0–4.5)

## 2017-09-07 LAB — POC GLUCOSE
Lab: 183 mg/dL — ABNORMAL HIGH (ref 70–100)
Lab: 77 mg/dL — ABNORMAL HIGH (ref 70–100)
Lab: 105 mg/dL — ABNORMAL HIGH (ref 70–100)

## 2017-09-07 LAB — MAGNESIUM: Lab: 2.3 mg/dL — ABNORMAL LOW (ref 1.6–2.6)

## 2017-09-07 LAB — COMPREHENSIVE METABOLIC PANEL: Lab: 137 MMOL/L — ABNORMAL LOW (ref 137–147)

## 2017-09-07 LAB — CBC: Lab: 6.7 K/UL — ABNORMAL LOW (ref 4.5–11.0)

## 2017-09-07 MED ORDER — BLOOD SUGAR DIAGNOSTIC MISC STRP
1 | ORAL_STRIP | Freq: Three times a day (TID) | 3 refills | 30.00000 days | Status: AC
Start: 2017-09-07 — End: ?

## 2017-09-07 MED ORDER — INSULIN GLARGINE 100 UNIT/ML (3 ML) SC INJ PEN
14 [IU] | Freq: Every evening | SUBCUTANEOUS | 0 refills | 60.00000 days | Status: AC
Start: 2017-09-07 — End: ?

## 2017-09-07 MED ORDER — BLOOD-GLUCOSE METER MISC KIT
PACK | Freq: Three times a day (TID) | 0 refills | 50.00000 days | Status: AC
Start: 2017-09-07 — End: ?
  Filled 2017-09-07 (×2): qty 1, 30d supply, fill #1

## 2017-09-07 MED ORDER — LANCETS MISC MISC
1 | Freq: Three times a day (TID) | 11 refills | Status: AC
Start: 2017-09-07 — End: ?

## 2017-09-07 NOTE — Progress Notes
This RN received report from TroutvilleBriana, Charity fundraiserN. Care for pt assumed.

## 2017-09-07 NOTE — Discharge Instructions - Appointments
Remember you MUST take your insulin!    You should follow up with your primary care provider mid next week.

## 2017-09-07 NOTE — Discharge Instructions - Pharmacy
Physician Discharge Summary      Name: Kristen Raymond  Medical Record Number: [id number]        Account Number:  [id number]  Date Of Birth:  31-Mar-1991                         Age:  26 years   Admit date:  09/05/2017                     Discharge date:  09/07/2017    Attending Physician:  Ailene Ravel, MD               Service: Med ICU 2 - 1948    Physician Summary completed by: Jarrett Ables, APRN    Reason for hospitalization: Diabetic Ketoacidosis     Significant PMH:   Past Medical History:   Diagnosis Date   ??? DM (diabetes mellitus) (HCC)     Type 1         Allergies: Pcn [penicillins] and Vancomycin    Brief Hospital Course:  The patient was admitted and the following issues were addressed during this hospitalization:       Kristen Raymond is a 26 y.o. female with a PMH of: DM1.???She presented to [facility] for DKA from noncompliance w/ insulin; then tx'd her to [facility] for further management. DKA managed w/ IVF & insulin gtt; now back on home insulin regimen & tolerating PO. Incidentally found to be flu positive, stopped tamiflu w/ worsened nausea. Stressed the importance of taking her insulin to manage her DM, she has insulin & syringes at home, will give glucometer & prescription for test strips.     Condition at Discharge: Stable    Discharge Diagnoses:    Hospital Problems        Active Problems    * (Principal) DKA (diabetic ketoacidoses) (HCC)    Tachycardia    Elevated alkaline phosphatase level    Influenza A (H1N1)          Surgical Procedures: None    Significant Diagnostic Studies and Procedures: none    Consults:  None    Patient Disposition: Home       Patient instructions/medications:      Activity as Tolerated    It is important to keep increasing your activity level after you leave the hospital.  Moving around can help prevent blood clots, lung infection (pneumonia) and other problems.  Gradually increasing the number of times you are up moving around will help you return to your normal activity level more quickly.  Continue to increase the number of times you are up to the chair and walking daily to return to your normal activity level. Begin to work toward your normal activity level at discharge     Report These Signs and Symptoms    Please contact your doctor if you have any of the following symptoms: temperature higher than 100 degrees F, uncontrolled pain, persistent nausea and/or vomiting, difficulty breathing, chest pain, severe abdominal pain, headache, unable to urinate, unable to have bowel movement or drainage with a foul odor     Questions About Your Stay    For questions or concerns regarding your hospital stay. Call [phone]     Discharging attending physician: Ailene Ravel [[id number]]      Diabetic Diet    You should eat between 1600 and 2000 calories per day.  This is equal to 60g (grams) of carbohydrates per meal, and 30g  of carbohydrates for a bedtime snack.    If you have questions about your diet after you go home, you can call a dietitian at [phone].     Return Appointment      Current Discharge Medication List       START taking these medications    Details   blood sugar diagnostic (GLUCOCARD EXPRESSION) test strip Use one strip as directed three times daily. ICD-10: Type 1 Diabetes with unspecified complications; with long term insulin use E11.8, Z79.4  Qty: 300 strip, Refills: 3    PRESCRIPTION TYPE:  Normal      Blood-Glucose Meter (GLUCOCARD EXPRESSION) kit ICD-10: Type 1 Diabetes with unspecified complications; with long term insulin use E11.8, Z79.4  Qty: 1 kit, Refills: 0    PRESCRIPTION TYPE:  Normal      lancets MISC Use one each as directed three times daily before meals.  Qty: 300 each, Refills: 11    PRESCRIPTION TYPE:  Normal  Comments: ICD-10: Type 1 Diabetes with unspecified complications; with long term insulin use E11.8, Z79.4 CONTINUE these medications which have been CHANGED or REFILLED    Details   insulin glargine (LANTUS SOLOSTAR, BASAGLAR) 100 unit/mL (3 mL) injection PEN Inject fourteen Units under the skin at bedtime daily.    PRESCRIPTION TYPE:  No Print          CONTINUE these medications which have NOT CHANGED    Details   Acetone (Urine) Test strp Use 1 Strip as directed as Needed (Please check urine ketones with elevated or low blood sugars). Indications: DIAGNOSTIC TEST FOR KETONURIA  Qty: 25 Each, Refills: 5    PRESCRIPTION TYPE:  Normal      famotidine (PEPCID) 20 mg tablet Take 1 Tab by mouth daily.  Refills: 3    PRESCRIPTION TYPE:  No Print      !! insulin aspart (NOVOLOG FLEXPEN) 100 unit/mL injection PEN Inject 4 Units under the skin three times daily with meals.  Qty: 3 Package, Refills: 3    PRESCRIPTION TYPE:  Normal      !! insulin aspart (NOVOLOG FLEXPEN) 100 unit/mL injection PEN Inject 0-7 Units under the skin five times daily.  Qty: 3 Package, Refills: 3    PRESCRIPTION TYPE:  Normal      insulin pen needles (disposable) (BD UF NANO PEN NEEDLES) 32 gauge x 5/32 pen needle Use 1 Each as directed as Needed. Use with insulin injections.  Qty: 300 Each, Refills: 3    PRESCRIPTION TYPE:  Normal      Insulin Syringe-Needle U-100 1 mL 31 gauge x 5/16 syrg Use daily when injecting insulin.  Qty: 300 Each, Refills: 3    PRESCRIPTION TYPE:  Normal      lanolin (LAN-O-SOOTHE) topical cream Apply  topically to affected area as Needed.  Qty: 56 g, Refills: 3    PRESCRIPTION TYPE:  Normal      scopolamine (TRANSDERM-SCOP) 1.5 mg 3 day patch Apply 1 Patch to top of skin as directed every 72 hours.  Qty: 10 Patch, Refills: 1    PRESCRIPTION TYPE:  Normal  Associated Diagnoses: Nausea and vomiting in pregnancy       !! - Potential duplicate medications found. Please discuss with provider.       The following medications were removed from your list. This list includes medications discontinued this stay and those removed from your prior med list in our system        LORazepam (ATIVAN) 0.5 mg tablet  oxyCODONE (ROXICODONE, OXY-IR) 5 mg tablet               Scheduled appointments:     Remember you MUST take your insulin!    You should follow up with your primary care provider mid next week.                    Pending items needing follow up:   - f/u w/ primary care provider for further DM management    Signed:  Jarrett Ables, APRN  09/07/2017      cc:  Primary Care Physician:  Lavone Orn     Referring physicians:  Clement Sayres, *   Additional provider(s):

## 2017-09-07 NOTE — Progress Notes
0800 Assumed pt care at this time. Bedside safety check performed, plan of care reviewed. Physical assessment complete, please see ICU flowsheet for details. VSS. Will continue to monitor.     1015 Vannia Mckone discharged on 09/07/2017.   Marland Kitchen.  Discharge instructions reviewed with patient.  Valuables returned: purse and cellphone   .  Home medications: N/A   .

## 2017-09-07 NOTE — Progress Notes
Critical Care   Progress Note     Kristen Raymond  Today's Date:  09/07/2017  Admission Date: 09/05/2017  LOS: 2 days    Principal Problem:    DKA (diabetic ketoacidoses) (HCC)  Active Problems:    Tachycardia    Elevated alkaline phosphatase level    Influenza A (H1N1)        Assessment/Plan:      Hospital Course:   [name] is a 26 y.o. female with a PMH of: DM1. She presented to [facility] for DKA from noncompliance w/ insulin; then tx'd her to [facility] for further management. DKA managed w/ IVF & insulin gtt; now back on home insulin regimen & tolerating PO. Incidentally found to be flu positive, stopped tamiflu w/ worsened nausea. Stressed the importance of taking her insulin to manage her DM, she has insulin & syringes at home, will give glucometer & prescription for test strips. Will plan to DC home.     NEURO:   - hold PTA: ativan 0.5mg  q12 prn  - hold PTA: oxy IR 5mg  q4 prn    CV:  Tachycardia (improved)  - d/t DKA  - EKG - no ST changes  - trop - 0.02  - HR 100s & SBP 90-100s  ???  PULM:   - RA  ???  GI:   - continue PTA: pepcid  Elevated ALP  - has been elevated since 09/2015  - ALP 142 -> 115 -> 120  - AST/ALT/Tbili - WNL  - GGTP - 19  - monitor  Nausea (resolved)  - d/t DM1 vs tamiflu  - compazine prn --> no doses since 1400 on 12/15  - much better this AM and ate breakfast this AM???    RENAL:   - Cr 0.39  - IO: 1.6 / 1.9  ???  ENDO:   DKA (resolved)  DM1  - d/t noncompliance (endorsed by pt)  - at OSH: BG 693, 7.29/6//8, AG 36, BHB 12.8 received 2L IVF + 10u insulin sq  - on admit to [place]: AG 13, BG 214, BHB 3.3 & CO2 18  - UDS - Neg  - beta HCG - Neg  - a1c 12/14 - 18.1  - dietician consulted  - diabetes RN educator consulted  - diabetic diet --> now eating  - continue PTA Lantus 14u hs, novolog 4u tid + LDCF  - BG 130-218    ID:   Influenza A Infection  - WBC 5.4; afebrile  - CX 12/14 (OSH) - no infiltrate  - BC 12/14 - NGTD  - UA 12/14 - bland  - PCT 12/14 - 0.36 - RVP 12/14 +infleunza A H1N1  - no abx  - will hold on additional tamiflu --> pt is asymptomatic and with persistent nausea  ???  Prophylaxis Review:  Lines: PIV  Tubes: noneR  IVF: S/L  Electrolytes: Reviewed and replaced as needed.   Diet: Yes  Insulin: Yes  Urinary Catheter: No  VTE ppx: Lovenox; SCDs  GI ppx: no indication  PT/OT: Yes  ???  Code status: Full Code   Disposition: Will DC home.    Jarrett Ables, APRN   Pulm/Critical Care  Pager [phone]   09/07/2017    M2 team pager (2nd call/nights) [phone]  __________________________________________________________________________________    Subjective:        Kristen Raymond is a 26 y.o. female who is resting in bed and feels much better this AM. No nausea since yesterday and ate breakfast this AM. Feels  ready to go home.    ROS: Denies: CP, pain, SOB, N/V, cough, C/D and rash.    Objective:        Medications:  Scheduled Meds:    enoxaparin (LOVENOX) syringe 40 mg 40 mg Subcutaneous QDAY(21)   famotidine (PEPCID) tablet 20 mg 20 mg Oral QDAY   insulin aspart U-100 (NOVOLOG FLEXPEN) injection PEN 0-7 Units 0-7 Units Subcutaneous 5 X Day   insulin aspart U-100 (NOVOLOG FLEXPEN) injection PEN 4 Units 4 Units Subcutaneous TID w/ meals   insulin glargine (LANTUS SOLOSTAR, BASAGLAR) injection PEN 14 Units 14 Units Subcutaneous QHS(22)   Continuous Infusions:  PRN and Respiratory Meds:prochlorperazine Q6H PRN                       Vital Signs: Last Filed                  Vital Signs: 24 Hour Range   BP: 99/67 (12/16 0414)  Temp: 36.7 ???C (98 ???F) (12/16 0414)  Pulse: 104 (12/16 0414)  Respirations: 16 PER MINUTE (12/16 0414)  SpO2: 100 % (12/16 0414)  O2 Delivery: None (Room Air) (12/16 0414)  SpO2 Pulse: 101 (12/15 2319) BP: (96-118)/(63-89)   Temp:  [36.6 ???C (97.9 ???F)-36.8 ???C (98.3 ???F)]   Pulse:  [98-111]   Respirations:  [12 PER MINUTE-31 PER MINUTE]   SpO2:  [93 %-100 %]   O2 Delivery: None (Room Air)     Vitals:    09/06/17 0000   Weight: 50.2 kg (110 lb 10.7 oz) Intake/Output Summary:  (Last 24 hours)    Intake/Output Summary (Last 24 hours) at 09/07/2017 3244  Last data filed at 09/07/2017 0600  Gross per 24 hour   Intake 1649 ml   Output 1900 ml   Net -251 ml         Physical Exam:         General:  Alert, cooperative, no distress, appears stated age  Head:  Normocephalic, without obvious abnormality, atraumatic  Eyes:  Conjunctivae/corneas clear  Mouth/throat: Moist mucous membranes  Neck:  Supple, symmetrical, trachea midline  Lungs:  Clear to auscultation bilaterally; no wheeze  Heart:  Regular rate and rhythm, S1, S2 normal, no murmur appreciated  Abdomen:  Soft, non-tender.  Bowel sounds normal.  Extremities:  Extremities normal, atraumatic, no cyanosis or edema  Pulses:   2+ and symmetric, all extremities  Skin:   No rashes or lesions noted  Neurologic:  A&O    Laboratory:  LABS:  Recent Labs      09/05/17   0550  09/05/17   1205  09/05/17   1648  09/06/17   0310  09/06/17   1550  09/07/17   0400   NA  138  133*  132*  134*  134*  137   K  3.9  3.8  4.1  3.6  3.2*  3.9   CL  107  105  101  102  103  104   CO2  18*  21  18*  24  24  24    GAP  13*  7  13*  8  7  9    BUN  15  10  8  7   6*  5*   CR  0.67  0.53  0.49  0.46  0.45  0.39*   GLU  214*  244*  196*  169*  106*  84   CA  8.6  7.9*  8.0*  7.9*  7.9*  8.0*   ALBUMIN  3.9   --    --   3.3*   --   3.4*   MG  1.8   --   2.3  2.0  1.7  2.3   PO4  1.8*   --   2.1  1.5*  1.7*  2.3   HGBA1C  18.1*   --    --    --    --    --        Recent Labs      09/05/17   0550  09/06/17   0310  09/07/17   0400   WBC  7.3  5.4  6.7   HGB  13.5  13.5  14.5   HCT  40.6  39.7  43.3   PLTCT  232  195  185   AST  25  27  36   ALT  24  17  15    ALKPHOS  142*  115*  120*   TNI  0.02   --    --       Estimated Creatinine Clearance: 94.8 mL/min (A) (based on SCr of 0.39 mg/dL (L)).  Vitals:    09/06/17 0000   Weight: 50.2 kg (110 lb 10.7 oz)    No results for input(s): PHART, PO2ART in the last 72 hours.    Invalid input(s): PC02A Radiology and Other Diagnostic Procedures Review:    Reviewed

## 2017-09-11 LAB — CULTURE-BLOOD W/SENSITIVITY

## 2017-12-25 ENCOUNTER — Encounter: Admit: 2017-12-25 | Discharge: 2017-12-25

## 2017-12-25 DIAGNOSIS — E119 Type 2 diabetes mellitus without complications: Principal | ICD-10-CM

## 2021-02-02 ENCOUNTER — Encounter: Admit: 2021-02-02 | Discharge: 2021-02-02

## 2021-02-02 DIAGNOSIS — E119 Type 2 diabetes mellitus without complications: Secondary | ICD-10-CM

## 2022-06-11 ENCOUNTER — Encounter: Admit: 2022-06-11 | Discharge: 2022-06-11

## 2022-06-17 ENCOUNTER — Encounter: Admit: 2022-06-17 | Discharge: 2022-06-17

## 2022-06-17 NOTE — Telephone Encounter
RN called patient and confirmed upcoming NEW DM appointment    Patient is using Meter to check glucose numbers  Patient is checking on average 4 a day  Patients is on Admelog & Levemir  Patient has [employer]      Patient has been seen by Dr. Eulas Post at [facility] clinic in [place].    Donnella Sham, RN

## 2022-06-20 ENCOUNTER — Encounter: Admit: 2022-06-20 | Discharge: 2022-06-20 | Payer: Medicaid Other

## 2022-06-20 ENCOUNTER — Ambulatory Visit: Admit: 2022-06-20 | Discharge: 2022-06-21 | Payer: Medicaid Other

## 2022-06-20 ENCOUNTER — Encounter: Admit: 2022-06-20 | Discharge: 2022-06-20 | Payer: MEDICAID

## 2022-06-20 DIAGNOSIS — E119 Type 2 diabetes mellitus without complications: Secondary | ICD-10-CM

## 2022-06-20 MED ORDER — BAQSIMI 3 MG/ACTUATION NA SPRY
NASAL | 0 refills | 16.00000 days | Status: AC
Start: 2022-06-20 — End: ?

## 2022-06-20 MED ORDER — DEXCOM G7 SENSOR MISC DEVI
1 | 3 refills | Status: AC
Start: 2022-06-20 — End: ?

## 2022-06-20 NOTE — Progress Notes
Patient didn't bring meter.  Prepped for foot exam.

## 2022-06-20 NOTE — Progress Notes
Applied Dexcom G7 CGM on patient today.  Pt is using phone app as receiver with Dexcom G7 device; set up account today.    EDUCATION PROVIDED:    General  No calibrations needed, unless requested  Updates blood sugars every 5 minutes  Receiver and transmitter must remain within 20 feet for connection  Dexcom is measuring interstitial fluid vs blood capillary so there may be a lag time    Display Device  Shows glucose information    Applicator with Built-in Sensor  Sensor applicator inserts under your skin  Change every 10 days  Sensor 4 digit code on back of applicator  30 min warm up    Using Trend Arrows  Steady = changing less than 1 mg/dL each minute  Slowly rising or falling = changing 1-2 mg/dL each minute  Rising or falling = changing 2-3 mg/dL each minute  Rapidly rising or falling = changing more than 3 mg/dL each minute  No arrow = can't determine trend    Alerts & Alarms  Urgent Low at 55 mg/dL = ON  Low alert = 85 mg/dL  High alert = 454 mg/dL      Showering, Bathing, and Swimming  The sensor is water resistant and can be worn while bathing, showering or swimming; avoid taking it deeper than 3 ft or keeping under water for longer than 30 minutes at a time    Getting Dressed  Use care to avoid catching the sensor on clothing while getting dressed    Exercising  Intense exercise may cause the sensor to loosen due to sweat or movement of the sensor    Contact Dexcom Customer Support if:  Your sensor becomes loose or is removed  You have irritation or discomfort at the sensor site  You have any questions about your sensor      Removing sensor  When sensor has ended, pull up the edge of the adhesive that keeps your sensor atached to your skin. Slowly peel away from your skin in one motion    Special Circumstances  Medical procedures-  If you have an MRI, a CT scan or Diathermy treatment, you must remove the sensor prior to the procedure    Security checkpoints  Notify security at airport or check points, that you have a continuous glucose sensor placed    Clarity and Follow app  Explained benefit of follow app and reviewing reports on Clarity

## 2022-06-20 NOTE — Progress Notes
ATTESTATION    I personally performed the key portions of the E/M visit, discussed case with resident and concur with resident documentation of history, physical exam, assessment, and treatment plan unless otherwise noted.    Staff name:  Lajean Manes, MD Date: 06/20/2022

## 2022-06-21 ENCOUNTER — Encounter: Admit: 2022-06-21 | Discharge: 2022-06-21 | Payer: Medicaid Other

## 2022-06-21 DIAGNOSIS — R809 Proteinuria, unspecified: Secondary | ICD-10-CM

## 2022-06-21 DIAGNOSIS — E1029 Type 1 diabetes mellitus with other diabetic kidney complication: Secondary | ICD-10-CM

## 2022-06-21 NOTE — Telephone Encounter
Received an eye exam don 09/18/2021  Entered into HM and placed to scan to chart

## 2022-06-24 ENCOUNTER — Encounter: Admit: 2022-06-24 | Discharge: 2022-06-24 | Payer: Medicaid Other

## 2022-06-24 NOTE — Telephone Encounter
RN started paperwork to order Dexcom G7 through [facility]  RN filled out and printed off needed information  Given to  Dr. Ledell Peoples to sign

## 2022-07-12 ENCOUNTER — Encounter: Admit: 2022-07-12 | Discharge: 2022-07-12 | Payer: Medicaid Other

## 2022-07-19 ENCOUNTER — Encounter: Admit: 2022-07-19 | Discharge: 2022-07-19 | Payer: Medicaid Other

## 2022-07-19 DIAGNOSIS — E10649 Type 1 diabetes mellitus with hypoglycemia without coma: Secondary | ICD-10-CM

## 2022-07-19 DIAGNOSIS — E1029 Type 1 diabetes mellitus with other diabetic kidney complication: Secondary | ICD-10-CM

## 2022-07-19 NOTE — Telephone Encounter
I have reviewed the CGM  Data between Saturday, October 14 and Friday, October 27 show 7% Sensor usage, just 1 day out of 14 of data.  The trend is only for October 26.  She appears to have really high blood glucose overnight and then a steep decline in blood glucose between 7 AM and 2 PM.  She then has significant rises and falls the rest of the day    There is more comprehensive data between September 30 and Friday, July 05, 2013 days.  Sensor usage is 86%, 12 out of 14 days.  Her average glucose is 200 mg/dL with a standard deviation of 95.    Her timing ranges only 46%, 22% high 27% very high.  She has 4% low and 1% very low.    On review of her glucose patterns, she typically has a pattern of nighttime highs and daytime highs.  I can see that she has some overnight hypoglycemia and some evening hypoglycemia her daily patterns show that her blood glucose control is very erratic, often times fluctuating between high and low multiple times of the day    On Friday, October 13 she had multiple alarms for urgent low between 12:57 AM and 6:57 AM      Given her severe lows, Please call this patient and relay the following results and plan:      ? Reduce basal insulin by 20%, thus give 32 units nightly.  From there treat to target backup if needed.  Increase basal insulin by 1 unit every 3 days until morning blood glucose is at goal.  If she has another low blood sugar overnight or first thing in the morning while fasting, immediately reduce basal insulin by another 20%.  ? Avoid bedtime snack if possible.  She should not be eating at bedtime to prevent lows, she should be reducing her basal insulin.  There are very large spikes most nights after midnight but I am not sure what her mealtimes are.  ? Please do not give 10 units of NovoLog before meals if her meal is low in carbs.  She can give 4 units for a low carb meal, 6 units for middle carb medium sized meal, and 8 units for a higher carb meal and try that.  ? I will try to get her in next week sometime while I am on service, but in the meantime she needs to keep a journal of her food and activities and insulin doses for me.  ? For any further low blood sugars tonight or over the weekend, please ask her to call the endocrinologist on-call this weekend.  She can call the main hospital [phone] and ask for Endo on-call.  I want to make sure that she has enough assistance over the weekend to avoid any more severe hypoglycemia.  Please ensure that she wears her continuous glucose monitor at all times and carries sources of glucose on hand as well as her emergency glucagon.

## 2022-07-23 ENCOUNTER — Encounter: Admit: 2022-07-23 | Discharge: 2022-07-23 | Payer: Medicaid Other

## 2022-07-23 NOTE — Telephone Encounter
Called and  Confirmed diabetes educator appointment on November 14 at 12 at [place], [place].   Address provided to patient.

## 2022-07-25 ENCOUNTER — Encounter: Admit: 2022-07-25 | Discharge: 2022-07-25 | Payer: Medicaid Other

## 2022-07-25 ENCOUNTER — Ambulatory Visit: Admit: 2022-07-25 | Discharge: 2022-07-26 | Payer: Medicaid Other

## 2022-07-25 DIAGNOSIS — E119 Type 2 diabetes mellitus without complications: Secondary | ICD-10-CM

## 2022-07-25 NOTE — Patient Instructions
Diabetes Discharge Instructions    Insulin Treatment Dosing:    Basal or Long-Acting insulin -- this insulin dose helps ensure your body has enough insulin between meals and overnight.    Levemir is your long acting (basal) Insulin. Give 20 units at the same time every day.      You may increase the Levemir every 3 days by (1) units if the morning blood sugar average is greater than 140.    If you experience a night time or early morning low blood glucose that does not have a clear explanation then reduce the Levemir dose the next evening by (2) units.        Meal insulin -- this insulin dose helps your body process sugar from the meal you are ABOUT TO eat.    Novolog insulin 3 units before small carb meal, 4 units for medium carb meal, 5 units for large carb meal      Correction or Sliding-Scale insulin -- this insulin dose helps you decrease sugar that is ALREADY in your blood.  It is ADDED to your meal insulin dose in 1 shot.    70 - 150: No added units  150-200:  No added units  200-250: add 1 units  250-300:  add 2 units  300 or above add 3 units      If you have any questions or concerns, please reach out to my nurse, Lauren. You can reach her via MyChart or call her directly at [phone].    If it is after hours / weekend and you need Urgent assistance, please call the main hospital at [phone] and ask for the Endocrinologist on call.       Low blood sugar recognition and treatment:    A low blood sugar (less than 70) is also called hypoglycemia or a hypo. If you take insulin or certain oral drugs (sulfonylureas such as glimepiride, glipizide and glyburide), you are at risk for hypoglycemia. Drinking alcohol without eating can also increase your chances of having hypoglycemia. Wearing a medical a;ert bracelet ([email or web address]) can save your life if you have a severe hypoglycemic reaction.      Low blood sugar (less than 70mg /dL) usually causes a variety of symptoms. These include:  Shakiness  Weakness  Sweating  Hunger  Confusion  Nightmares or nighttime sweats if the low blood sugar happens while you are asleep    If untreated, extreme low blood sugar can lead to:  Unconsciousness or stupor  Seizures    What to do if you have hypoglycemia.  Treat low blood sugars as soon as possible using the rule of 15s.    Check your blood sugar (if you can't do this, just treat the symptoms!)  If it is less than 70 mg/dl, eat 15 grams of carbohydrate (4 glucose tabs, 4oz of juice, 4oz regular soda, 15 Skittles, etc).  Be sure it is primarily carbs (NOT chocolate, cookie, or PBCrackers)  Wait 15 minutes and RECHECK your blood sugar.  If you blood sugar is still less than 70, take another 15 grams of carbohydrate and re-check your sugar in 15 minutes.    When should you call your doctor or health care provider?     If you have more than 1 mild reaction per week.  If you have any moderate or severe reactions where you cannot treat the reaction yourself, need to go to the emergency room or call 911  Experience hypoglycemia without having symptoms (hypoglycemic unawareness).  For persistent hyperglycemia: (blood sugars persistently above 300 and having trouble getting it down)    - If patient can keep fluids down and remain hydrated:   Take 5 units of short-acting insulin every 60 minutes until blood sugar is less than 200 mg/dl.   Inject it into the upper arm as deeply as you can into the muscle  (deltoid).   Every hour, drink a cup of chicken soup or diet Gatorade or Powerade zero.   If blood sugar does not come down below 200 mg/dl within 4 hours, call endocrine clinic for assistance.  If after hours, call [phone] and ask to speak to the endocrinologist on call.    - If patient is vomiting or can't keep fluids down, must go to the emergency room to get IV fluids

## 2022-07-29 ENCOUNTER — Encounter: Admit: 2022-07-29 | Discharge: 2022-07-29 | Payer: Medicaid Other

## 2022-07-29 DIAGNOSIS — E1029 Type 1 diabetes mellitus with other diabetic kidney complication: Secondary | ICD-10-CM

## 2022-07-29 DIAGNOSIS — E119 Type 2 diabetes mellitus without complications: Secondary | ICD-10-CM

## 2022-07-31 ENCOUNTER — Encounter: Admit: 2022-07-31 | Discharge: 2022-07-31 | Payer: Medicaid Other

## 2022-07-31 DIAGNOSIS — E1029 Type 1 diabetes mellitus with other diabetic kidney complication: Secondary | ICD-10-CM

## 2022-07-31 LAB — COMPREHENSIVE METABOLIC PANEL
ALBUMIN: 3.7 g/dL (ref 3.5–5.0)
ALK PHOSPHATASE: 95 U/L (ref 40–150)
ALT: 19 U/L (ref 0–55)
ANION GAP: 7 meq/L (ref 8–16)
AST: 20 U/L (ref 5–34)
BLD UREA NITROGEN: 26 mg/dL (ref 7.0–18.7)
CALCIUM: 9.1 mg/dL (ref 8.4–10.2)
CHLORIDE: 112 mmol/L (ref 98–107)
CO2: 22 mmol/L (ref 22–29)
CREATININE: 0.8 mg/dL (ref 0.57–1.11)
GFR ESTIMATED: 84 mL/min/1.73 (ref >59)
GLUCOSE,PANEL: 79 mg/dL (ref 70–105)
POTASSIUM: 4.7 mmol/L (ref 3.5–5.1)
SODIUM: 141 mmol/L (ref 136–145)
TOTAL BILIRUBIN: 0.3 mg/dL (ref 0.2–1.2)
TOTAL PROTEIN: 6.5 g/dL (ref 6.4–8.3)

## 2022-08-01 ENCOUNTER — Encounter: Admit: 2022-08-01 | Discharge: 2022-08-01 | Payer: Medicaid Other

## 2022-08-01 DIAGNOSIS — E1029 Type 1 diabetes mellitus with other diabetic kidney complication: Secondary | ICD-10-CM

## 2022-08-06 ENCOUNTER — Encounter: Admit: 2022-08-06 | Discharge: 2022-08-06 | Payer: Medicaid Other

## 2022-08-06 ENCOUNTER — Ambulatory Visit: Admit: 2022-08-06 | Discharge: 2022-08-07 | Payer: Medicaid Other

## 2022-08-06 DIAGNOSIS — E1029 Type 1 diabetes mellitus with other diabetic kidney complication: Secondary | ICD-10-CM

## 2022-08-06 NOTE — Patient Instructions
Patient Instructions   It was a pleasure seeing you today, Kristen Raymond.     Below are the topics that we discussed during our visit.     1) Start adding balance to meals and snacks with protein and/or vegetables    2)Treat to target to help adjust your basal or long lasting insulin dose.  Adjust your Levemir based on your fasting blood sugar and overnight blood sugar pattern every 1 day.   - If your blood sugar higher than 130 mg/dL add 1 units from your last dose.  -  If your blood sugar less than 80 mg/dL reduce 1 units from your last dose.    3) 1:50>200    If your blood sugar is above 200 before the meal, add additional insulin to your mealtime insulin dose:    200-250: Take 1 units  250-300: Take 2 units  300-350: Take 3 units  350-400: Take 4 units  >400: Take 5units    Don't give a correction dose closer together than 4 hours.   Free Online Programs 2023    How to register for our [facility] Classes at [email or web address]  Register for classes 48 hours in advance by emailing [email or web address] or calling [phone]  Register via [facility]: [email or web address]    You will receive Zoom link after registration.  (All classes are subject to change, and you will be notified of any changes after the registration)    Monthly Diabetes Survival Skills: Providing basic survival skills class with information tailored to meet your needs. It's also great for people who want to refresh their diabetes knowledge after completing diabetes group classes-- Every 1st Tue  of each month from 1.30-2.30 pm (*except Jul)  Date offered:   Nov 7  Dec 5       Monthly Cooking with [facility]:  [employer] with [employer] offering healthy, easy home cooking recipes by registered dietitian who is specialized in diabetes and weight management-- Every 4th Wed of each month from 12-1 pm (*except Jun and Dec)  Date offered: Oct 25  Nov 22  Dec 20*    Monthly Diabetes Support Group : Ongoing diabetes education & support for people with diabetes and family-- Every 1st Wed of each month, 6-7:30 pm   Date offered: Nov 1  Dec 6    Monthly Ani-inflammatory Diet: Learn how food can help your body have an appropriate inflammatory response and lower chronic inflammation. Learn principles of choosing foods, practical meal planning tools and tips for grocery shopping-- Every 3rd Tue of each month from 1.30-2.30 pm   Date offered:  Oct 17  Nov 21  Dec 19     Introduction to Low Carb and Keto Diet Plans in Diabetes Management: Learn if the low Carb or Keto Diet Plan is appropriate for you in helping with diabetes management -Every 2 months on 3rd Mon from 12-1 pm  Date offered:  Oct 16  Dec 18    FREE In-person HEALTHY COOKING CLASS on Wednesday August 07, 2022, from 1.30-3.30 PM at [employer]: center For [facility]--[address], [address], [place], [place] [address]   Register Free Here    Moving for Better Health: Come learn about current exercise recommendations, health benefits of exercise, and how to get started with goal setting. Bring your questions, barriers to exercise and share helpful tips for how you have implemented exercise into your life.  Date offered:  Oct 31    Stress and Your Health: We will discuss the  various causes of stress, how stress impacts health and healthy coping strategies to reduce stress.  Date offered: Nov 21    Sleep and Your Health: We will discuss the importance of sleep, health consequences of inadequate sleep and tips for how to improve sleep quality and quantity.  Date offered: Dec 19           Free Online Programs 2024                                              Monthly Diabetes Survival Skills: Providing basic survival skills class with information tailored to meet your needs. It's also great for people who want to refresh their diabetes knowledge after completing diabetes group classes-- Every 3rd Wednesday of each month from 12-1 PM   Date offered:   Jan 17  Feb 21  Mar 20  Apr 17  May 15  Jun 19     Jul 17   Aug 21  Sep 18  Oct 16  Nov 20  Dec 18      Prediabetes and Diabetes Prevention: Learn about lifestyle and diet changes to prevent type II diabetes and decrease insulin resistance--on selected Tuesdays from 12-1 PM  Date offered:  Jan 9   Feb 13  Mar 12  Apr 9   May 14    Jun 11  Jul 9   Sep 10  Nov 12    Nutritional Supplement Class: Learn how targeted nutritional supplementation can help support your health and wellness goals. We will review supplemental strategies that may benefit conditions such as cardiovascular disease, diabetes, and gastrointestinal health. Learn how to identify good quality supplements so you can be more confident about choosing the right brands. - Selected Tue from 1:30-2:45 pm  Date offered:  Feb 6  March 5 April 2 May 7    June 4 Aug 6  Oct 1  Dec 3  Type 1 Diabetes Meet Up Group: Ongoing diabetes education & support specifically for people with type 1 diabetes and family--Selected Wed from 5.30- 6.30 pm   Date offered: Mar 6  May 8  Sep 4  Nov 6    Diabetes Support Group: Ongoing diabetes education & support for people with diabetes and family--Selected Wed from 5.30 - 7pm    Date offered: Feb 7  Apr 3  Jun5  Aug 7  Oct 2  Dec 4     Virtual Cooking with [facility]:  Secretary[employer]administ[employer] with [employer] offering healthy, easy home cooking recipes by registered dietitian who is specialized in diabetes and weight management. -- on selected Wed from 12-1 PM    Date offered: Feb 28        Apr 24         May 29           Jul 24       Aug 28    Oct 23  Nov 27    In-person Healthy Cooking Classes: This cooking class provides cooking tips and skills to help you make healthy, easy home cooking recipes at home.  The class is led by experienced registered dietitians who are specialized in diabetes care, weight management and nutrition related chronic conditions--Time 1-3 PM       Date offered:  Mar 6  Jun 5  Sep 4  Dec 11  Introduction to Low Carb and Keto Diet Plans in Diabetes Management: Learn if the low Carb or Keto Diet Plan is appropriate for you in helping with diabetes management -On selected Mon from 12-1 pm  Date offered:  Mar 18  Jun 17  Sep 16  Dec 16    Ani-inflammatory Diet: Learn how food can help your body have an appropriate inflammatory response and lower chronic inflammation. Learn principles of choosing foods, practical meal planning tools and tips for grocery shopping list-- Selected Tue from 1.30-2.45 pm   Date offered:  Jan 16  Mar 19  May 21 July 16  Sep 17  Nov 19     The Wellness Series: Regardless of your current health, everyone can benefit from knowing how to maintain healthy blood sugar from diet and lifestyle and using these tools for life. The series will return in summer of 2024. Watch recorded classes at [email or web address]        Please do not hesitate to call or send a MyChart message with any questions or concerns.      Take care,    Malachi Kinzler, MS, RD, LD  Registered Dietitian, Diabetes Educator  [facility]  [facility]     For appointments: [phone]  Endo on call: [phone] after hours

## 2022-08-06 NOTE — Progress Notes
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 DM (diabetes mellitus) (HCC)     Type 1         Diabetes Pathophysiology and Treatment Options  Date of Diagnosis: age 31  Previous diabetes education: none  Patient's goals for diabetes education: better control  Pertinent Co-morbidities:     HgA1c HISTORY:  Lab Results   Component Value Date    HGBA1C 18.1 (H) 09/05/2017    HGBA1C 6.5 (H) 12/13/2015    HGBA1C 7.7 (H) 06/08/2015      Lab Results   Component Value Date    A1C 10.4 (H) 06/20/2022       LIPID PROFILE:  No results found for: CHOL, HDL, LDL, VLDL, TRIG    Renal function:  Albumin/creatinine ratio future  GFR WNL 6 days ago    WEIGHT HISTORY:  Wt Readings from Last 3 Encounters:   07/25/22 72.3 kg (159 lb 6.4 oz)   06/20/22 71.1 kg (156 lb 12.8 oz)   09/06/17 50.2 kg (110 lb 10.7 oz)     BMI Readings from Last 1 Encounters:   07/25/22 28.56 kg/m?       Chronic Complications: Preventing Detecting Treatment    Dilated eye exam: has seen in last year  Dental exam: will see soon  Foot exam by healthcare professional: last visit with Dr. Reece Agar  Daily Foot exams: Yes  Flu Vaccine: Plans to get    Is the patient pregnant? s/p tubal ligation    Healthy Eating    Meal Planning Method:  Who shops/cooks food? Pt currently lives with family, and so it is difficult to cook at home, she states they eat out often  Diet Recall:  Breakfast AM Snack Lunch PM Snack Dinner Evening Snack   None usually Yogurt recently If at work she will eat in between rushes, first meal is usually around 1-2pm Donzetta Sprung at work Generally eating out, sometimes only meal of the day will be eating out Drinks:  Alcohol:  Eating Out Frequency:1x/d  Diet Comments: struggles with carb counting, pt states she does not prefer many veggies, said cucumber and carrots are ok. (see intake upload)    Healthy Eating and Health Literacy  Can the patient:  State which food raise blood glucose? Yes  Can the patient properly demonstrate how to read a food label? Yes    Being Active  Type, Frequency, Duration of Exercise: nothing planned outside of being on her feet at work  Barriers to physical activity:    Taking Medications    Prescribed:  Levemir 20U (TTT), Novolog U small meal, 6U med meal, 8U large meal, CF of 1:50>200 (high insulin sensitivity per Dr. Reece Agar); pt states she has not been adjusting Levemir and under dosing novolog for fear of lows  Previous Medications:  Pt. Missing Doses of Diabetes Meds: No  Injection Locations: Abdomen  Rotate Injection Locations: Yes  Timing of Bolus: Before meal  Storage of injectable medications: medicine bag    Insulin Pump: No, working on getting Tandem pump   Pt. Missing Doses of Diabetes Meds: N/A            Infusion Set  Locations: N/A  Rotate Infusion Set Locations: N/A  Days Between Site Changes: N/A  Storage of injectable medications:             Pump Backup Plan:   Timing of Bolus: N/A  Pump settings:     Taking Medications and Health Literacy:  List diabetes oral medications? N/A   List diabetes insulin/injectables? Yes Dose? Yes State sliding scale? Yes State if it causes hypoglycemia? Yes    Monitoring Glucose  Continuous Glucose Monitor? Dexcom G7       Monitoring Glucose and Health Literacy  Can the patient:  State blood glucose targets? No  If using CGM, state time in range target? No  State A1C target? Yes    Acute Complications: Preventing Detecting Treatment    Hypoglycemia  Hypoglycemia Frequency: Has been experiencing some lately after meal time insulin administration  Hypoglycemia Unawareness? No, feels lows around 60  Symptoms: shakey, sweaty, confused, fatigue  Treatment of hypoglycemia: candy (skittles), apple juice, sweet tea at work  Medical ID: Does not have  Patient Has Current Glucagon Kit: NC  Does pt carry source of carbohydrate: Yes  Hypoglycemia comment: pt very fearful of lows due to recent severe lows    Hyperglycemia  Hyperglycemia Frequency: Frequent  Does the patient have a history of DKA? Yes, been a while  How does the patient treat hyperglycemia? Insulin, or doesn't  Check for ketones at home as recommended: No  Hyperglycemia comment: Pt fears hypoglycemia, resulting in overcorrection and under dosing of insulin    Does the patient know how to manage diabetes on sick days? No    Is the patient prepared with diabetes medications and supplies in case they have to leave home with little notice and uncertainty of how long? 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 [employer]  Work Hours: Wed/thurs - 9-4; Fri/Sat - 9-9  Learns Best By: not answered  Tobacco Use: Yes   Alcohol Use: No  Learning Barriers:       Diabetes Distress and Support  Things about diabetes that causes the patient stress or distress: eating  How the patient deals with stress/distress: nothing at this point  Primary support person: boyfriend    Pre-Education Assessment:  Diabetes Pathophysiology and Treatment Options 2  Healthy Eating 1  Being Active 1  Taking Medications 2  Monitoring Glucose 2  Acute Complications 2  Chronic Complications 2  Lifestyle and Healthy Coping 2  Diabetes Distress and Support 2    1 = needs instruction  2 = needs review  3 = comprehends key points  4 = demonstrates understanding/competency  NC = not covered  N/A = not applicable    Education Plan Based on Participant Concerns and Assessed Needs  Diabetes Pathophysiology and Treatment Options, Healthy Eating, Being Active, Taking Medications, Monitoring Glucose, Acute Complications, Chronic Complications, Lifestyle and Healthy Coping and Diabetes Distress and Support    Education Intervention  Specific Areas of Concern Today: Pt comes to appointment today stating that she has spent a lot of years ignoring her diabetes and she would now like to regain control of it. She states she has a lot of anxiety about food and managing blood sugars through diet. She is currently waiting for her house to be built, and is living in her boyfriend's parent's basement, and so cooking at  home is near impossible, per pt. She has also had some scary lows that required EMS to be called, and so is very worried about giving insulin. Per Dr. Timoteo Expose most recent visit with pt, pt seems to be very insulin sensitive. She is waiting to get a Tandem pump sometime soon, hopefully. Today we reviewed diabetes and balanced eating for diabetes. We talked about strategies to help bring some balance to her eating pattern, even though it is erratic and unpredictable at times. Pt feels once she is able to move into the house being built, it will get better as they will be able to cook. I provided her with our education notebook as well as our free classes. I also showed her a demo tandem pump and explained briefly what it will be like. We had her download the tandem tslim X2 simulator on her phone so she can start exploring it on her own. I encouraged her to continue to treat to target and use her mealtime insulin appropriately and we can continue to make adjustments to ensure she isn't having lows. I would like to review balanced eating at our next visit, and talk more about how exercise effects blood sugar so she can plan appropriately for her working days.     Learning  Education Type: 1 on 1 Outpatient DSME      Education Provided  Disease Process: Pathophysiology, Types of Diabetes, Natural Course of Diabetes  Carbohydrate Counting: Estimating portion sizes, Fiber, Main sources of carbohydrate, How nutrients affect BG, Plate Model  Healthy Eating: Tips for eating out/Special occasions, Increase fruits/vegetables, Including protein, Plate model  Medications: Mechanism of action of insulin  Blood Glucose Monitoring: When to contact HCP  Acute Complications: Hypoglycemia: Fast-acting carbohydrates, Follow-up treatment, Medical ID, Rule of 15, Signs and symptoms  Insulin Pumps and Continuous Glucose Monitoring: Introduction to pumping, Using bolus calculators  Chronic Complications: Annual dental and eye exams, Dental Care, Foot care, Microvascular body systems affected by diabetes, Macrovascular body systems affected by diabetes  Psychosocial Adjustment: Goal setting, Community resources  Handouts Given: [facility] Education Notebook, Other (comment) (How to update Tandem pump at home)         Patient Instructions     Patient Instructions   It was a pleasure seeing you today, Chasidy.     Below are the topics that we discussed during our visit.     1) Start adding balance to meals and snacks with protein and/or vegetables    2)Treat to target to help adjust your basal or long lasting insulin dose.  Adjust your Levemir based on your fasting blood sugar and overnight blood sugar pattern every 1 day.   - If your blood sugar higher than 130 mg/dL add 1 units from your last dose.  -  If your blood sugar less than 80 mg/dL reduce 1 units from your last dose.    3) 1:50>200    If your blood sugar is above 200 before the meal, add additional insulin to your mealtime insulin dose:    200-250: Take 1 units  250-300: Take 2 units  300-350: Take 3 units  350-400: Take 4 units  >400: Take 5units    Don't give a correction dose closer together than 4 hours.   Free Online Programs 2023    How to register for our [facility] Classes at [email or web address]  - Register for classes 48 hours in advance by emailing [email or web address] or calling [phone]  - Register via [facility]: [email or web address]  You will receive Zoom link after registration.  (All classes are subject to change, and you will be notified of any changes after the registration)    - Monthly Diabetes Survival Skills: Providing basic survival skills class with information tailored to meet your needs. It's also great for people who want to refresh their diabetes knowledge after completing diabetes group classes-- Every 1st Tue  of each month from 1.30-2.30 pm (*except Jul)  Date offered:   Nov 7  Dec 5       - Monthly Cooking with [facility]:  Free [employer] with [employer] offering healthy, easy home cooking recipes by registered dietitian who is specialized in diabetes and weight management-- Every 4th Wed of each month from 12-1 pm (*except Jun and Dec)  Date offered: Oct 25  Nov 22  Dec 20*    - Monthly Diabetes Support Group : Ongoing diabetes education & support for people with diabetes and family-- Every 1st Wed of each month, 6-7:30 pm   Date offered: Nov 1  Dec 6    - Monthly Ani-inflammatory Diet: Learn how food can help your body have an appropriate inflammatory response and lower chronic inflammation. Learn principles of choosing foods, practical meal planning tools and tips for grocery shopping-- Every 3rd Tue of each month from 1.30-2.30 pm   Date offered:  Oct 17  Nov 21  Dec 19    -  Introduction to Low Carb and Keto Diet Plans in Diabetes Management: Learn if the low Carb or Keto Diet Plan is appropriate for you in helping with diabetes management -Every 2 months on 3rd Mon from 12-1 pm  Date offered:  Oct 16  Dec 18    - FREE In-person HEALTHY COOKING CLASS on Wednesday August 07, 2022, from 1.30-3.30 PM at [employer]: [facility]--[address], [address], [place], [place] [address]   Register Free Here    - Moving for Better Health: Come learn about current exercise recommendations, health benefits of exercise, and how to get started with goal setting. Bring your questions, barriers to exercise and share helpful tips for how you have implemented exercise into your life.  Date offered:  Oct 31    - Stress and Your Health: We will discuss the various causes of stress, how stress impacts health and healthy coping strategies to reduce stress.  Date offered: Nov 21    - Sleep and Your Health: We will discuss the importance of sleep, health consequences of inadequate sleep and tips for how to improve sleep quality and quantity.  Date offered: Dec 19           Free Online Programs 2024                                              - Monthly Diabetes Survival Skills: Providing basic survival skills class with information tailored to meet your needs. It's also great for people who want to refresh their diabetes knowledge after completing diabetes group classes-- Every 3rd Wednesday of each month from 12-1 PM   Date offered:   Jan 17  Feb 21  Mar 20  Apr 17  May 15  Jun 19     Jul 17   Aug 21  Sep 18  Oct 16  Nov 20  Dec 18      - Prediabetes and Diabetes  Prevention: Learn about lifestyle and diet changes to prevent type II diabetes and decrease insulin resistance--on selected Tuesdays from 12-1 PM  Date offered:  Jan 9   Feb 13  Mar 12  Apr 9   May 14    Jun 11  Jul 9   Sep 10  Nov 12    - Nutritional Supplement Class: Learn how targeted nutritional supplementation can help support your health and wellness goals. We will review supplemental strategies that may benefit conditions such as cardiovascular disease, diabetes, and gastrointestinal health. Learn how to identify good quality supplements so you can be more confident about choosing the right brands. - Selected Tue from 1:30-2:45 pm  - Date offered:  Feb 6  March 5 April 2 May 7    June 4 Aug 6  Oct 1  Dec 3  - Type 1 Diabetes Meet Up Group: Ongoing diabetes education & support specifically for people with type 1 diabetes and family--Selected Wed from 5.30- 6.30 pm   Date offered: Mar 6  May 8  Sep 4  Nov 6    - Diabetes Support Group: Ongoing diabetes education & support for people with diabetes and family--Selected Wed from 5.30 - 7pm    Date offered: Feb 7  Apr 3  Jun5  Aug 7  Oct 2  Dec 4     - Virtual Cooking with [facility]:  Secretary/administrator with [employer] offering healthy, easy home cooking recipes by registered dietitian who is specialized in diabetes and weight management. -- on selected Wed from 12-1 PM    Date offered: Feb 28        Apr 24         May 29           Jul 24       Aug 28    Oct 23  Nov 27    In-person Healthy Cooking Classes: This cooking class provides cooking tips and skills to help you make healthy, easy home cooking recipes at home.  The class is led by experienced registered dietitians who are specialized in diabetes care, weight management and nutrition related chronic conditions--Time 1-3 PM       Date offered:  Mar 6  Jun 5  Sep 4  Dec 11      - Introduction to Low Carb and Keto Diet Plans in Diabetes Management: Learn if the low Carb or Keto Diet Plan is appropriate for you in helping with diabetes management -On selected Mon from 12-1 pm  Date offered:  Mar 18  Jun 17  Sep 16  Dec 16    - Ani-inflammatory Diet: Learn how food can help your body have an appropriate inflammatory response and lower chronic inflammation. Learn principles of choosing foods, practical meal planning tools and tips for grocery shopping list-- Selected Tue from 1.30-2.45 pm   Date offered:  Jan 16  Mar 19  May 21 July 16  Sep 17  Nov 19     The Wellness Series: Regardless of your current health, everyone can benefit from knowing how to maintain healthy blood sugar from diet and lifestyle and using these tools for life. The series will return in summer of 2024. Watch recorded classes at [email or web address]        Please do not hesitate to call or send a MyChart message with any questions or concerns.      Take care,    Ashleynicole Mcclees, MS, RD, LD  Registered Dietitian, Diabetes  Educator  [employer] [facility]  [place] [facility]     For appointments: [phone]  Endo on call: [phone] after hours       Education Learning Outcomes  Diabetes Pathophysiology and Treatment Options 3  Healthy Eating 2  Being Active NC  Taking Medications 2  Monitoring Glucose 3  Acute Complications NC  Chronic Complications NC  Lifestyle and Healthy Coping NC  Diabetes Distress and Support NC    1 = needs instruction  2 = needs review  3 = comprehends key points  4 = demonstrates understanding/competency  NC = not covered  N/A = not applicable                              Participant Selected Action Oriented Behavioral Change Goal/s and Outcomes   Patient-Selected Behavior Change Objective Area: Nutritional Management/Healthy Eating   Patient-Selected Behavior Change Goal: Add protein and vegetables to meals to help balance the carbs, even when eating out   Patient-Selected Behavior Change Goal Baseline % Attainment: 0%             Participant Clinical Outcome  Diabetes Program Outcome Monitoring and Evaluation: A1C  Program Outcome at Baseline: 10.4%          DSMES Communicated with Referring Provider  Provider is within EMR and is able to view education intervention and education learning outcomes.    Start Time: 1059  Stop Time: 1215    Follow-up: Return in about 2 months (around 10/06/2022) for In-Person.    It was a pleasure to see you today!  Theola Cuellar, MS, RD, LD  Diabetes Educator

## 2022-08-07 DIAGNOSIS — R809 Proteinuria, unspecified: Secondary | ICD-10-CM

## 2022-09-06 ENCOUNTER — Encounter: Admit: 2022-09-06 | Discharge: 2022-09-06 | Payer: Medicaid Other

## 2022-09-11 ENCOUNTER — Encounter: Admit: 2022-09-11 | Discharge: 2022-09-11 | Payer: Medicaid Other

## 2022-09-11 ENCOUNTER — Ambulatory Visit: Admit: 2022-09-11 | Discharge: 2022-09-12 | Payer: Medicaid Other

## 2022-09-11 NOTE — Progress Notes
Patient needing extra time with tandem today to get pump set up and understand carb bolusing etc.   I will cancel this appt today and set up a telephone visit with her in one week.   No charge.

## 2022-09-12 DIAGNOSIS — R809 Proteinuria, unspecified: Secondary | ICD-10-CM

## 2022-09-12 DIAGNOSIS — E1029 Type 1 diabetes mellitus with other diabetic kidney complication: Secondary | ICD-10-CM

## 2022-09-18 ENCOUNTER — Encounter: Admit: 2022-09-18 | Discharge: 2022-09-18 | Payer: Medicaid Other

## 2022-09-18 DIAGNOSIS — E1065 Type 1 diabetes mellitus with hyperglycemia: Secondary | ICD-10-CM

## 2022-09-18 NOTE — Telephone Encounter
RN pulled download for Dr. Margy Clarks to review.

## 2022-09-19 ENCOUNTER — Encounter: Admit: 2022-09-19 | Discharge: 2022-09-19 | Payer: Medicaid Other

## 2022-09-19 ENCOUNTER — Ambulatory Visit: Admit: 2022-09-19 | Discharge: 2022-09-20 | Payer: Medicaid Other

## 2022-09-19 DIAGNOSIS — E1065 Type 1 diabetes mellitus with hyperglycemia: Secondary | ICD-10-CM

## 2022-09-19 DIAGNOSIS — E10649 Type 1 diabetes mellitus with hypoglycemia without coma: Secondary | ICD-10-CM

## 2022-09-19 DIAGNOSIS — E119 Type 2 diabetes mellitus without complications: Secondary | ICD-10-CM

## 2022-09-19 NOTE — Patient Instructions
NEW PUMP SETTINGS:    BASAL RATE 1.0 (from 1.1)    Rest stays the same:  Correction Factor 1:50  Carb Ratio 1:15  Target 110    Go to auto mode  Use sleep mode for sleep  Use exercise mode for Work

## 2022-09-19 NOTE — Progress Notes
PHONE VISIT    Subjective:       History of Present Illness  Kristen Raymond is a 31 y.o. female who presents to endocrine clinic for management of her history of type 1 diabetes. She follows with Dr. Verneda Skill for primary care.    Since her last visit, she had been on the tandem + G6 since last week  Her husband is working on the upgrade for G7    Since being on the pump for a week, she is really happy with it.   First night - she got low, so she's been in exercise mode since then.  Yesterday, very high BGs, believes it was a bad insertion of the pump change, once she replaced the pump again - BG under better control. This AM woke up in the 100s.   She notes lows overnight too and mid afternoon  She is still learning how to carb bolus.   She's struggling with CHO counting because she may not eat her full carb portion.  She sometimes doesn't know     Hx of T1DM  - dx age 23  - hx of high A1c (10-18%) per her recall  - Complications: Diabetic retinopathy  - Admits: for grade 3 hypoglycemia Oct 2023, DKA      Active Ambulatory Problems     Diagnosis Date Noted    Type 1 diabetes mellitus with hypoglycemia and without coma (HCC) 06/07/2015    Anxiety 06/28/2015    Alcohol abuse 06/28/2015    Rubella non-immune status, antepartum 07/04/2015    Supervision of high risk pregnancy in third trimester 08/09/2015    Localized swelling, mass and lump, upper limb 09/12/2015    Nausea and vomiting of pregnancy, antepartum 10/12/2015    Diabetes mellitus affecting pregnancy 10/21/2015    Diabetes in pregnancy 11/18/2015    DKA (diabetic ketoacidoses) 12/13/2015    Non-intractable cyclical vomiting with nausea 12/13/2015    Hyponatremia 12/14/2015    Tachycardia 09/05/2017    Elevated alkaline phosphatase level 09/05/2017    Influenza A (H1N1) 09/05/2017    Uncontrolled type 1 diabetes mellitus with hyperglycemia (HCC) 07/29/2022    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) 07/29/2022 Resolved Ambulatory Problems     Diagnosis Date Noted    Diabetic ketoacidosis (HCC) 07/15/2015    Diabetic keto-acidosis (HCC) 08/09/2015    Diabetic ketoacidosis without coma associated with type 1 diabetes mellitus (HCC) 09/01/2015    Diabetic ketoacidosis without coma associated with type 1 diabetes mellitus (HCC) 10/12/2015     Past Medical History:   Diagnosis Date    DM (diabetes mellitus) (HCC)      Surgical History:   Procedure Laterality Date    CESAREAN SECTION N/A 11/29/2015    Performed by Sondra Barges, MD at [facility] OR    LIGATION TUBAL Bilateral 11/29/2015    Performed by Sondra Barges, MD at [facility] OR     Social History     Socioeconomic History    Marital status: Single   Tobacco Use    Smoking status: Every Day     Types: Cigarettes     Last attempt to quit: 05/29/2015     Years since quitting: 7.3   Substance and Sexual Activity    Alcohol use: No    Drug use: Yes     Types: Marijuana     Comment: States she stopped using the day before admission (08/28/15).  States she was using once  daily in the morning.     Family History   Problem Relation Age of Onset    Depression Mother     Diabetes Mother     Depression Maternal Grandmother     Diabetes Maternal Grandmother     Diabetes Maternal Grandfather     Diabetes Paternal Grandmother     Diabetes Paternal Grandfather     Cancer Other         great great grandmother    Arthritis Other         great grandparents    Stroke Other         great grandma    Cancer-Breast Neg Hx     Cervical Cancer Neg Hx     Cancer-Colon Neg Hx     Cancer-Ovarian Neg Hx     Cancer-Uterine Neg Hx     Anesthetic Complication Neg Hx     Bleeding Disorders Neg Hx     Cleft Lip Neg Hx     Cleft Palate Neg Hx     Heart Disease Neg Hx     High Cholesterol Neg Hx     Hypertension Neg Hx     Migraines Neg Hx     Miscarriages Neg Hx     Rashes/Skin Problems Neg Hx     Seizures Neg Hx     Thyroid Disease Neg Hx     Asthma Maternal Aunt     Diabetes Maternal Aunt           Review of Systems   Constitutional: Negative.    HENT: Negative.     Eyes: Negative.    Respiratory: Negative.     Cardiovascular: Negative.    Gastrointestinal: Negative.    Endocrine:        Hypoglycemia unawareness   Genitourinary: Negative.    Musculoskeletal: Negative.    Neurological: Negative.    Psychiatric/Behavioral: Negative.     All other systems reviewed and are negative.        Objective:          atorvastatin (LIPITOR) 40 mg tablet Take one tablet by mouth daily.    DEXCOM G7 SENSOR sensor device Use one each as directed every 10 days. Indications: type 2 diabetes mellitus    glucagon (BAQSIMI) 3 mg/actuation nasal spray Use 1 spray in single nostril. If no response, may repeat in 15 minutes using second device.    insulin aspart (NOVOLOG FLEXPEN) 100 unit/mL injection PEN Inject 4 Units under the skin three times daily with meals. (Patient taking differently: Inject four Units to six Units under the skin three times daily with meals.)    insulin detemir U-100 (LEVEMIR U-100 INSULIN) 100 unit/mL solution Inject thirty two Units under the skin daily.    insulin pen needles (disposable) (BD UF NANO PEN NEEDLES) 32 gauge x 5/32 pen needle Use 1 Each as directed as Needed. Use with insulin injections.    lancets MISC Use one each as directed three times daily before meals.    lisinopriL (ZESTRIL) 20 mg tablet Take one tablet by mouth daily.    syringe-needle,insulin,0.5 mL (INSULIN SYRINGE MISC) Use  as directed.     There were no vitals filed for this visit.    There is no height or weight on file to calculate BMI.     Physical Exam  Vitals and nursing note reviewed.   Constitutional:       General: She is not in acute distress.  Pulmonary:  Effort: No respiratory distress.   Neurological:      Mental Status: She is alert.   Psychiatric:         Mood and Affect: Mood normal.         Thought Content: Thought content normal.         Judgment: Judgment normal.     Labs reviewed    Pump download reviewed:        Impression:  - pump pauses between meals/overnight -excessive basal  - need to work on accurate CHO counting.      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 Can also consider a temp basal.  Instructed patient to always wear CGM, always have a source of glucose on her, keep a source of glucose in her car and at work, and keep her glucagon with her, at work, at home.    Regarding CGM prescription:  Patient is knowledgeable and understands how to use device  Patient agrees to connect information to our clinic   The patient will have adequate supervision       Future Appointments   Date Time Provider Department Center   10/08/2022 10:30 AM Frazier Richards, Race R, RD [id number]   11/06/2022 11:30 AM Juno Alers, Oran Rein, MD [email or web address]

## 2022-10-04 ENCOUNTER — Encounter: Admit: 2022-10-04 | Discharge: 2022-10-04 | Payer: Medicaid Other

## 2022-10-04 MED ORDER — INSULIN ASPART U-100 100 UNIT/ML SC SOLN
3 refills | 30.00000 days | Status: DC
Start: 2022-10-04 — End: 2022-10-04

## 2022-10-04 MED ORDER — INSULIN LISPRO 100 UNIT/ML SC SOLN
11 refills | 30.00000 days | Status: AC
Start: 2022-10-04 — End: ?

## 2022-10-08 ENCOUNTER — Encounter: Admit: 2022-10-08 | Discharge: 2022-10-08 | Payer: Medicaid Other

## 2022-11-06 ENCOUNTER — Ambulatory Visit: Admit: 2022-11-06 | Discharge: 2022-11-07 | Payer: Medicaid Other

## 2022-11-06 ENCOUNTER — Encounter: Admit: 2022-11-06 | Discharge: 2022-11-06 | Payer: Medicaid Other

## 2022-11-06 DIAGNOSIS — E10319 Type 1 diabetes mellitus with unspecified diabetic retinopathy without macular edema: Secondary | ICD-10-CM

## 2022-11-06 DIAGNOSIS — E1065 Type 1 diabetes mellitus with hyperglycemia: Secondary | ICD-10-CM

## 2022-11-06 DIAGNOSIS — E10649 Type 1 diabetes mellitus with hypoglycemia without coma: Secondary | ICD-10-CM

## 2022-11-06 DIAGNOSIS — E119 Type 2 diabetes mellitus without complications: Secondary | ICD-10-CM

## 2022-11-06 MED ORDER — LEVEMIR FLEXPEN 100 UNIT/ML (3 ML) SC INPN
14 [IU] | Freq: Every day | SUBCUTANEOUS | 0 refills | Status: AC
Start: 2022-11-06 — End: ?

## 2022-11-06 NOTE — Progress Notes
Diabetes Clinic Initial Consult    Subjective:       History of Present Illness  Kristen Raymond is a 32 y.o. female who presents to endocrine clinic for management of her history of type 1 diabetes. She follows with Dr. Verneda Skill for primary care.    Hx of T1DM  - dx age 62  - hx of high A1c (10-18%) per her recall  - Complications: Diabetic retinopathy; eye exam on Friday. She had a spell in vision  - Admits: for grade 3 hypoglycemia Oct 2023, DKA  Hx of severe, urgent hypoglycemia    Since her last visit, she's been on Tandem Tslim with control IQ    She is still on exercise mode.   Still struggling with CHO - sometimes doesn't eat all of her food. Sometimes can low-ball her food.       - DM complications assessment:  Diabetic retinopathy, hypoglycemia unawareness  - Risk Factor assessment:   HTN - on ACEI/ARB  HLD - on statin. Last lipid profile: 2023  ASA - taking      Tandem t:slim pump and Dexcom CGM are downloaded and reviewed by me on the day of the clinic visit  The date ranges between February 1 2 through November 06, 2022.  Control IQ is active 92% of the time  Her time CGM use is 95%.    Her average glucose is 129 mg/dL with a standard deviation of 39 and coefficient of variation 30%.  Her GMI is 6.4%  Her time in range is 87%!!  She is high 7.7% of the time and very high 1.5% of the time.  She has a 2.4% of the time and very low 1.3% of the time.      My interpretation of the ambulatory glucose profile is multiple pump positive.  Steep decline in glucose with correction.  Some postprandial hyperglycemia.  Multiple occasions of hyperglycemia around the time of missed carbohydrate bolusing. Decline in glucose after correction boluses. 100% time in exercise mode.    Insulin summary  Average total daily dose of insulin is 21 units.  Basal is 67%, 14.3 units, bolus is 33%, 6.89 units.  Average daily bolus is 6  Average daily carbs is 39  Her daily average bolus reviewed and shows control IQ bolus and 57% of the time  There are no overrides  She is changing her site and tubing every 3 to 4 days         Review of Systems   Constitutional: Negative.    HENT: Negative.     Eyes: Negative.    Respiratory: Negative.     Cardiovascular: Negative.    Gastrointestinal: Negative.    Endocrine:        Hypoglycemia unawareness   Genitourinary: Negative.    Musculoskeletal: Negative.    Neurological: Negative.    Psychiatric/Behavioral: Negative.     All other systems reviewed and are negative.        Objective:          atorvastatin (LIPITOR) 40 mg tablet Take one tablet by mouth daily.    DEXCOM G7 SENSOR sensor device Use one each as directed every 10 days. Indications: type 2 diabetes mellitus    glucagon (BAQSIMI) 3 mg/actuation nasal spray Use 1 spray in single nostril. If no response, may repeat in 15 minutes using second device.    insulin detemir U-100 (LEVEMIR FLEXPEN) 100 unit/mL (3 mL) injection pen Inject fourteen Units under the skin daily.  insulin lispro (HUMALOG U-100 INSULIN) 100 unit/mL injection Use via insulin pump Max Daily 100 units    insulin pen needles (disposable) (BD UF NANO PEN NEEDLES) 32 gauge x 5/32 pen needle Use 1 Each as directed as Needed. Use with insulin injections.    lancets MISC Use one each as directed three times daily before meals.    lisinopriL (ZESTRIL) 20 mg tablet Take one tablet by mouth daily.    syringe-needle,insulin,0.5 mL (INSULIN SYRINGE MISC) Use  as directed.     Vitals:    11/06/22 1120   BP: (!) 145/87   Pulse: 95   PainSc: Zero   Weight: 70.7 kg (155 lb 12.8 oz)   Height: 159.1 cm (5' 2.64)     Body mass index is 27.92 kg/m?Marland Kitchen     Physical Exam  Constitutional:       Appearance: Normal appearance.   HENT:      Head: Normocephalic and atraumatic.   Eyes:      Conjunctiva/sclera: Conjunctivae normal.   Pulmonary:      Effort: Pulmonary effort is normal. No respiratory distress.   Neurological:      General: No focal deficit present.      Mental Status: She is alert and oriented to person, place, and time.   Psychiatric:         Mood and Affect: Mood normal.         Behavior: Behavior normal.       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  Her time in range is at goal.  Congratulated the patient on her efforts  Do note that her current basal rates are about double what she is needing based on control IQ multiple pump pauses on a daily basis  Also note that her correction factor is aggressive.  There are multiple pump pauses on a daily basis  Complicated by diabetic retinopathy and hypoglycemia unawareness    Plan:   Change pump settings as follows:   Basal rate 1.0 u --> 0.6 u/hr   ISF 50 --> 60   I:C 1:15 (no change)    In case of pump failure - Levemir 14 units.   Continue Tandem pump and CGM  always have a source of glucose on her, keep a source of glucose in her car and at work, and keep her glucagon with her, at work, at home.  Meet with diabetes educator   Has back up insulin and unexpired glucagon at home and work.     Regarding CGM prescription:  Patient is knowledgeable and understands how to use device  Patient agrees to connect information to our clinic   The patient will have adequate supervision       Future Appointments   Date Time Provider Department Center   02/12/2023 11:30 AM Mayer Vondrak, Oran Rein, MD [email or web address]       Orders Placed This Encounter    GLUCOSE METER DOWNLOAD    POC GLUCOSE QUANTITATIVE BLOOD    POC HEMOGLOBIN A1C    insulin detemir U-100 (LEVEMIR FLEXPEN) 100 unit/mL (3 mL) injection pen     1. Type 1 diabetes mellitus with hyperglycemia (HCC)  2. Hypoglycemia due to type 1 diabetes mellitus (HCC)  POC GLUCOSE QUANTITATIVE BLOOD    POC HEMOGLOBIN A1C    GLUCOSE METER DOWNLOAD      3. 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 Type 1 diabetes mellitus with retinopathy, macular edema presence unspecified, unspecified laterality, unspecified retinopathy severity (HCC)

## 2022-11-11 ENCOUNTER — Encounter: Admit: 2022-11-11 | Discharge: 2022-11-11 | Payer: Medicaid Other

## 2022-11-11 NOTE — Telephone Encounter
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

## 2022-11-21 ENCOUNTER — Encounter: Admit: 2022-11-21 | Discharge: 2022-11-21 | Payer: Medicaid Other

## 2022-11-25 ENCOUNTER — Encounter: Admit: 2022-11-25 | Discharge: 2022-11-25 | Payer: Medicaid Other

## 2022-11-25 DIAGNOSIS — L0591 Pilonidal cyst without abscess: Secondary | ICD-10-CM

## 2022-12-02 ENCOUNTER — Encounter: Admit: 2022-12-02 | Discharge: 2022-12-02 | Payer: Medicaid Other

## 2022-12-02 ENCOUNTER — Ambulatory Visit: Admit: 2022-12-02 | Discharge: 2022-12-02 | Payer: Medicaid Other

## 2022-12-02 DIAGNOSIS — L0591 Pilonidal cyst without abscess: Secondary | ICD-10-CM

## 2022-12-02 LAB — POC CREATININE, RAD: CREATININE, POC: 0.7 mg/dL (ref 0.4–1.00)

## 2022-12-02 MED ORDER — IOHEXOL 350 MG IODINE/ML IV SOLN
80 mL | Freq: Once | INTRAVENOUS | 0 refills | Status: CP
Start: 2022-12-02 — End: ?
  Administered 2022-12-02: 16:00:00 80 mL via INTRAVENOUS

## 2022-12-02 MED ORDER — SODIUM CHLORIDE 0.9 % IJ SOLN
50 mL | Freq: Once | INTRAVENOUS | 0 refills | Status: CP
Start: 2022-12-02 — End: ?
  Administered 2022-12-02: 16:00:00 50 mL via INTRAVENOUS

## 2022-12-17 ENCOUNTER — Encounter: Admit: 2022-12-17 | Discharge: 2022-12-17 | Payer: Medicaid Other

## 2022-12-17 ENCOUNTER — Ambulatory Visit: Admit: 2022-12-17 | Discharge: 2022-12-18 | Payer: Medicaid Other

## 2022-12-17 DIAGNOSIS — E119 Type 2 diabetes mellitus without complications: Secondary | ICD-10-CM

## 2022-12-17 DIAGNOSIS — L0591 Pilonidal cyst without abscess: Secondary | ICD-10-CM

## 2022-12-17 DIAGNOSIS — E10649 Type 1 diabetes mellitus with hypoglycemia without coma: Secondary | ICD-10-CM

## 2022-12-17 NOTE — Patient Instructions
Please contact our clinic if you have any questions or concerns. Call us at [phone] or send an email through the MyChart system if you have non-urgent concerns. We will get back to you as soon as possible. For urgent or after hours needs, please call [phone] and ask for the [facility] on call provider.     NOTE: MyChart messages and phone calls received on weekends, on holidays, and after 4 pm on weekdays will NOT be seen until the following business day. If you have an urgent matter during these times, please call [phone] to reach the on-call team.     If you need prescription refills, please contact your pharmacy.     Our fax number is [phone]. Please be sure your name and date of birth are on any forms that are sent to us for completion. We will have them filled out and signed as soon as possible, but our providers are not in clinic every day, so it can take up to a week.     Lab and test results:  As a part of the CARES act, starting 12/23/2019, some results will be released to you via MyChart immediately and automatically.  You may see results before your provider sees them; however, your provider will review all these results and then they, or one of their team, will notify you of result information and recommendations.   Critical results will be addressed immediately, but otherwise, please allow us time to get back with you prior to you reaching out to us for questions.  This will usually take about 72 hours for labs and 5-7 days for procedure test results.    '

## 2022-12-17 NOTE — Progress Notes
Date of Service: 12/17/22      Subjective:     Kristen Raymond is a 32 y.o. female    History of Present Illness  32 year old female with past medical history of DM type I.  She is present today for pilonidal cyst.  CT 3/11 was negative for pilonidal cyst.  Patient reports that although she had I&D of the cyst in January 2023 it has not reoccurred.  She denies any pain at the site, erythema, drainage, tenderness to touch.  Denies any symptoms at this time.         ROS     10 point ROS as per HPI and otherwise neg  Medical History:   Diagnosis Date    DM (diabetes mellitus) (HCC)     Type 1      reports that she has been smoking cigarettes. She does not have any smokeless tobacco history on file. She reports current drug use. Drug: Marijuana. She reports that she does not drink alcohol.  Surgical History:   Procedure Laterality Date    CESAREAN SECTION N/A 11/29/2015    Performed by Sondra Barges, MD at [facility] OR    LIGATION TUBAL Bilateral 11/29/2015    Performed by Sondra Barges, MD at [facility] OR      atorvastatin (LIPITOR) 40 mg tablet Take one tablet by mouth daily.    DEXCOM G7 SENSOR sensor device Use one each as directed every 10 days. Indications: type 2 diabetes mellitus    glucagon (BAQSIMI) 3 mg/actuation nasal spray Use 1 spray in single nostril. If no response, may repeat in 15 minutes using second device.    insulin detemir U-100 (LEVEMIR FLEXPEN) 100 unit/mL (3 mL) injection pen Inject fourteen Units under the skin daily.    insulin lispro (HUMALOG U-100 INSULIN) 100 unit/mL injection Use via insulin pump Max Daily 100 units    insulin pen needles (disposable) (BD UF NANO PEN NEEDLES) 32 gauge x 5/32 pen needle Use 1 Each as directed as Needed. Use with insulin injections.    lancets MISC Use one each as directed three times daily before meals.    lisinopriL (ZESTRIL) 20 mg tablet Take one tablet by mouth daily.    syringe-needle,insulin,0.5 mL (INSULIN SYRINGE MISC) Use  as directed. Objective:  Vitals:    12/17/22 1310   BP: 139/84   Pulse: 93   Temp: 36.7 ?C (98.1 ?F)   Resp: 16   SpO2: 100%     Body mass index is 29 kg/m?Marland Kitchen    Physical Exam  Constitutional:       Appearance: Normal appearance.   HENT:      Head: Normocephalic and atraumatic.   Eyes:      Conjunctiva/sclera: Conjunctivae normal.   Pulmonary:      Effort: Pulmonary effort is normal.   Abdominal:      Palpations: Abdomen is soft.   Genitourinary:     Comments: none  Skin:     General: Skin is warm and dry.   Neurological:      Mental Status: He is alert.   Psychiatric:         Mood and Affect: Mood normal.         Behavior: Behavior normal.     IMAGING/DIAGNOSTICS:  CT A/P 3/11:  IMPRESSION     1. No CT evidence of pilonidal cyst or other pelvic fluid collection.   2. No pelvic lymphadenopathy or ascites.  Assessment & Plan:  Kristen Raymond was seen today for new patient.    Diagnoses and all orders for this visit:    Pilonidal cyst    Type 1 diabetes mellitus with hypoglycemia and without coma (HCC)      32 year old female with past medical history of DM type I.       1.  We discussed that in the absence of infection or anything filling the cyst at this time surgical intervention is not warranted.    2.  We did discuss keeping diabetes under control will help prevent cyst in the future.  Although there is no one presently that is not to say cyst cannot flare in the future.    3.  Discussed all of patients questions and concerns.  Encouraged patient to call the office with any further questions or concerns.    4.  Follow up as needed

## 2022-12-26 ENCOUNTER — Encounter: Admit: 2022-12-26 | Discharge: 2022-12-26 | Payer: Medicaid Other

## 2023-02-12 ENCOUNTER — Ambulatory Visit: Admit: 2023-02-12 | Discharge: 2023-02-13 | Payer: Medicaid Other

## 2023-02-12 ENCOUNTER — Encounter: Admit: 2023-02-12 | Discharge: 2023-02-12 | Payer: Medicaid Other

## 2023-02-12 DIAGNOSIS — E10649 Type 1 diabetes mellitus with hypoglycemia without coma: Secondary | ICD-10-CM

## 2023-02-12 DIAGNOSIS — E119 Type 2 diabetes mellitus without complications: Secondary | ICD-10-CM

## 2023-02-12 NOTE — Progress Notes
Diabetes Clinic Follow up    Subjective:       History of Present Illness  Kristen Raymond is a 32 y.o. female who presents to endocrine clinic for management of her history of type 1 diabetes. She follows with Dr. Verneda Skill for primary care.    Hx of T1DM  - dx age 57  - hx of high A1c (10-18%) per her recall  - Complications: Diabetic retinopathy; eye exam on Friday. She had a spell in vision  - Admits: for grade 3 hypoglycemia Oct 2023, DKA  Hx of severe, urgent hypoglycemia    Since her last visit, she's been on Tandem Tslim with control IQ  She's doing really well.  Mostly highs when she forgets to bolus for meal or snack. Pump pauses typically when she's busy at work [employer], [employer]) - not using exercise mode.     - DM complications assessment:  Diabetic retinopathy, hypoglycemia unawareness  - Risk Factor assessment:   HTN ??- on ACEI/ARB  HLD ? - on statin. Last lipid profile: 2023. No plans for fertility; has tubes tied  ASA - taking      Tandem t:slim pump and Dexcom CGM are downloaded and reviewed by me on the day of the clinic visit  The date ranges between Apr 25 - Feb 12 2023.  Control IQ is active 98% of the time  Her time CGM use is 99%.    Her average glucose is 148 mg/dL with a standard deviation of 52 and coefficient of variation 35%.  Her GMI is 6.9%  Over the last 4 weeks:  Her time in range is 79%  She is high 15% of the time and very high 5.3% of the time. She has a 0.6% of the time and very low 0.1% of the time.      My interpretation of the ambulatory glucose profile is:  - no more dangerous lows  - pump pauses mostly during busy work day  - prandial hyperglycemia due to missed boluses or delayed boluses.   - using sleep but not exercise mode    Insulin summary  Average total daily dose of insulin is 23 units.  Basal is 67%, 15.5 units, bolus is 33%, 7.7 units.  Average daily bolus is 6   Average daily carbs is 60  Her daily average bolus reviewed sows food is 51% of boluses, correction 16%, and control IQ 33%  There are no overrides  She is changing her site and tubing every 3 to 4 days         Review of Systems   Constitutional: Negative.    HENT: Negative.     Eyes: Negative.    Respiratory: Negative.     Cardiovascular: Negative.    Gastrointestinal: Negative.    Endocrine:        Hypoglycemia unawareness   Genitourinary: Negative.    Musculoskeletal: Negative.    Neurological: Negative.    Psychiatric/Behavioral: Negative.     All other systems reviewed and are negative.        Objective:          atorvastatin (LIPITOR) 40 mg tablet Take one tablet by mouth daily.    DEXCOM G7 SENSOR sensor device Use one each as directed every 10 days. Indications: type 2 diabetes mellitus    glucagon (BAQSIMI) 3 mg/actuation nasal spray Use 1 spray in single nostril. If no response, may repeat in 15 minutes using second device.    insulin detemir U-100 (LEVEMIR  FLEXPEN) 100 unit/mL (3 mL) injection pen Inject fourteen Units under the skin daily.    insulin lispro (HUMALOG U-100 INSULIN) 100 unit/mL injection Use via insulin pump Max Daily 100 units    insulin pen needles (disposable) (BD UF NANO PEN NEEDLES) 32 gauge x 5/32 pen needle Use 1 Each as directed as Needed. Use with insulin injections.    lancets MISC Use one each as directed three times daily before meals.    lisinopriL (ZESTRIL) 20 mg tablet Take one tablet by mouth daily.    syringe-needle,insulin,0.5 mL (INSULIN SYRINGE MISC) Use  as directed.     Vitals:    02/12/23 1119   BP: 120/86   Pulse: (P) 100   PainSc: Zero   Weight: 68.5 kg (151 lb)   Height: 156.2 cm (5' 1.5)     Body mass index is 28.07 kg/m?Marland Kitchen     Physical Exam  Constitutional:       Appearance: Normal appearance.   HENT:      Head: Normocephalic and atraumatic.   Eyes:      Conjunctiva/sclera: Conjunctivae normal.   Pulmonary:      Effort: Pulmonary effort is normal. No respiratory distress.   Neurological:      General: No focal deficit present.      Mental Status: She is alert and oriented to person, place, and time.   Psychiatric:         Mood and Affect: Mood normal.         Behavior: Behavior normal.       Diabetic Foot Exam       Bilateral vascular, sensation, integument are normal:  Yes     Assessment and Plan:    Type 1 Diabetes, improved control  Complicated by ?retinopathy and microalbumuria  Hypoglycemia unawareness  Hyperglycemia  A1c has significantly improved and she is now down to 6.3%!  Her time in range is at goal.  Congratulated the patient on her efforts  Complicated by diabetic retinopathy and hypoglycemia unawareness    Plan:   No Change to pump settings; instead, work toward more accurate/timely bolusing for carbs, and place pump in exercise mode during long work shifts and driving home from work  In case of pump failure - Levemir 14 units  Continue Tandem pump and CGM  Always have a source of glucose on her, keep a source of glucose in her car and at work, and keep her glucagon with her, at work, at home  Meet with diabetes educator   Has back up insulin and unexpired glucagon at home and work  Due for labs, ordered.     Regarding CGM prescription:  Patient is knowledgeable and understands how to use device  Patient agrees to connect information to our clinic   The patient will have adequate supervision       Future Appointments   Date Time Provider Department Center   06/17/2023 11:00 AM Daka, Kidist K, APRN-CNS [facility]       Orders Placed This Encounter    GLUCOSE METER DOWNLOAD    LIPID PROFILE    TSH WITH FREE T4 REFLEX    MICROALB/CR RATIO-URINE RANDOM    POC GLUCOSE QUANTITATIVE BLOOD    POC HEMOGLOBIN A1C     1. Type 1 diabetes mellitus with hyperglycemia (HCC)  POC GLUCOSE QUANTITATIVE BLOOD    POC HEMOGLOBIN A1C    GLUCOSE METER DOWNLOAD    LIPID PROFILE    TSH WITH FREE T4 REFLEX  MICROALB/CR RATIO-URINE RANDOM

## 2023-02-13 DIAGNOSIS — E1065 Type 1 diabetes mellitus with hyperglycemia: Secondary | ICD-10-CM

## 2023-06-08 IMAGING — CT ABDOMEN_PELVIS WO(Adult)
2 of 3 series · 12 of 46 positions shown, 14 images · non-contrast
Comparison: none

[Series 2: abdomen_pelvis ax 3.00 br40 s3 · axial · 0.56mm/px · z∈[+933,+1362]mm · 9 of 165 slices shown, 11 images]
[im 11/165  soft-tissue]
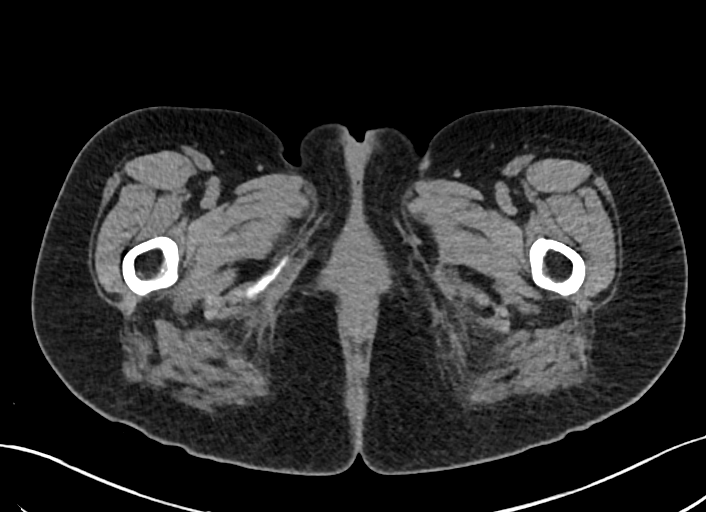
[im 11/165  bone]
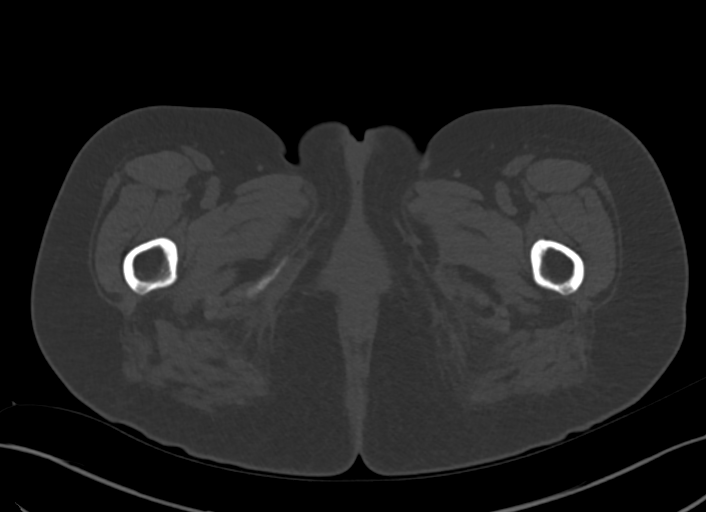
[im 32/165  soft-tissue]
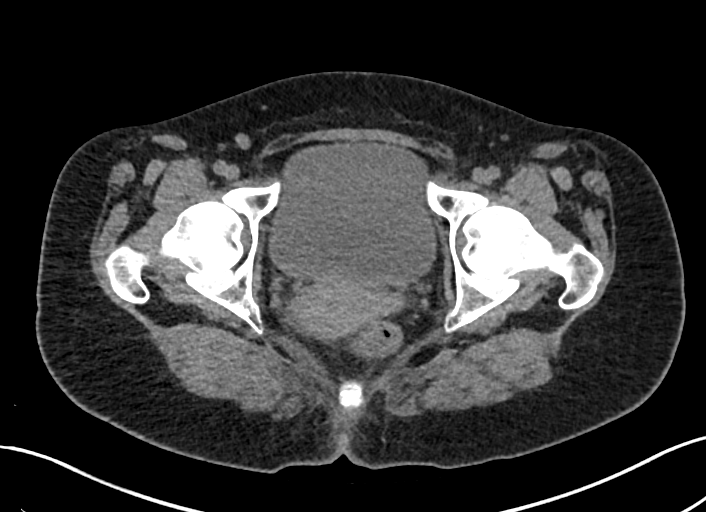
[im 48/165  soft-tissue]
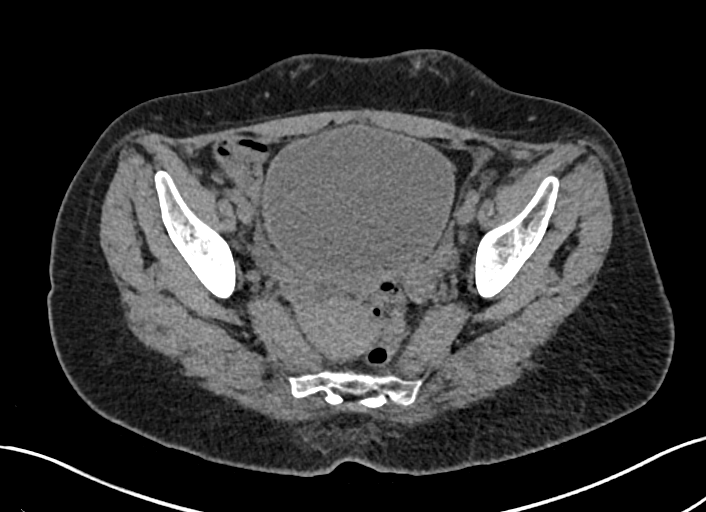
[im 64/165  soft-tissue]
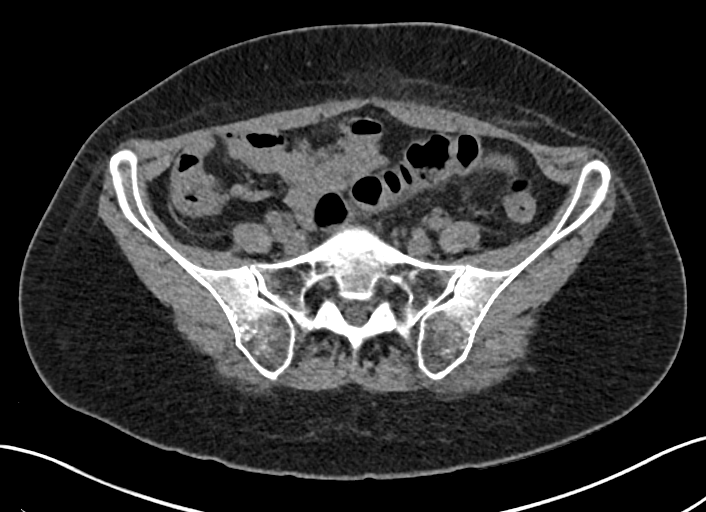
[im 85/165  soft-tissue]
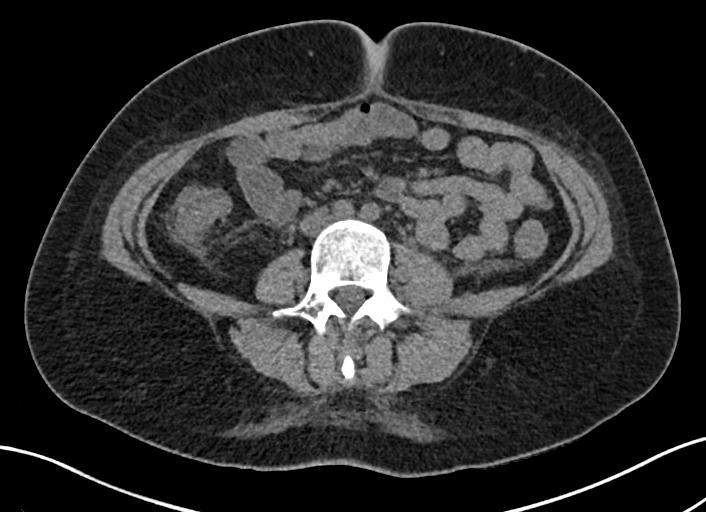
[im 101/165  soft-tissue]
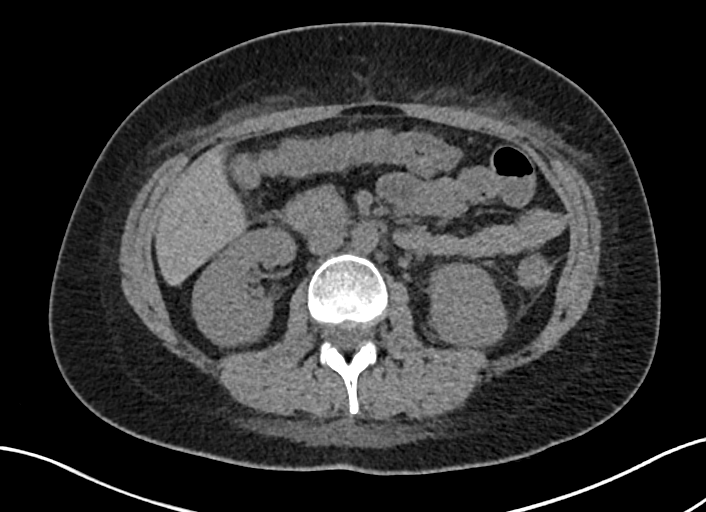
[im 117/165  soft-tissue]
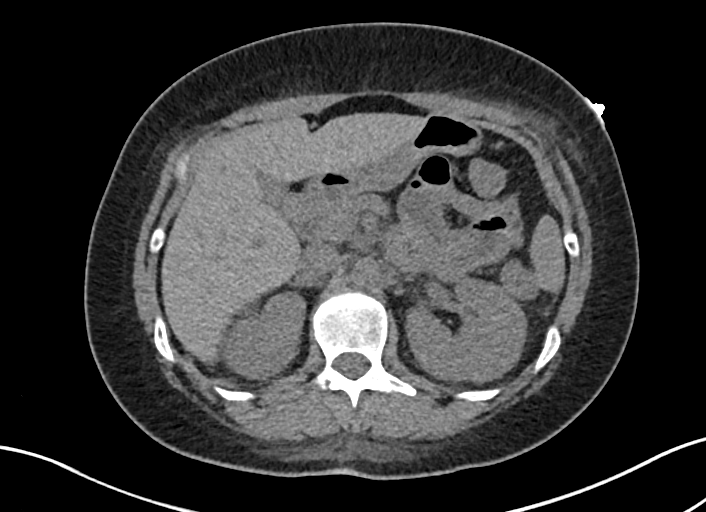
[im 138/165  soft-tissue]
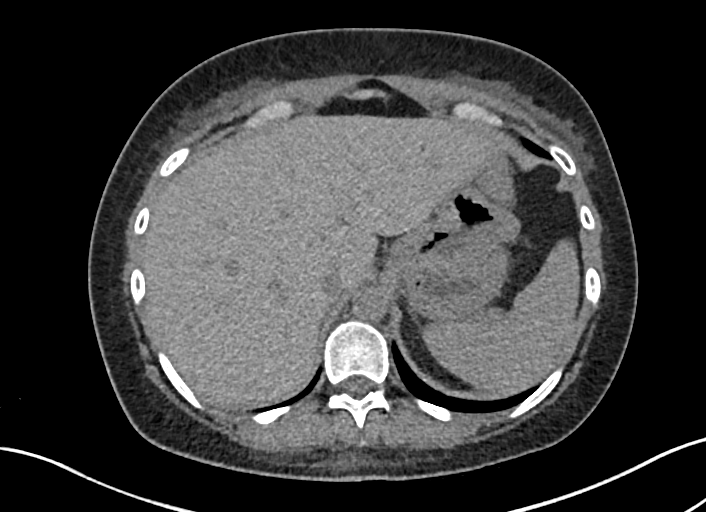
[im 154/165  soft-tissue]
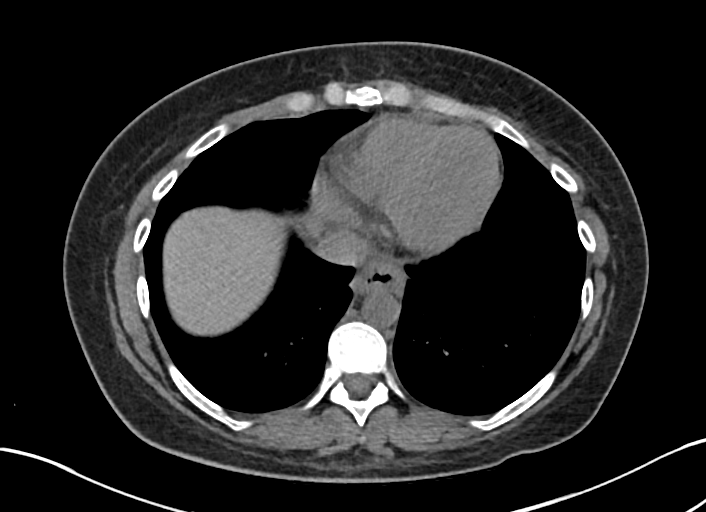
[im 154/165  bone]
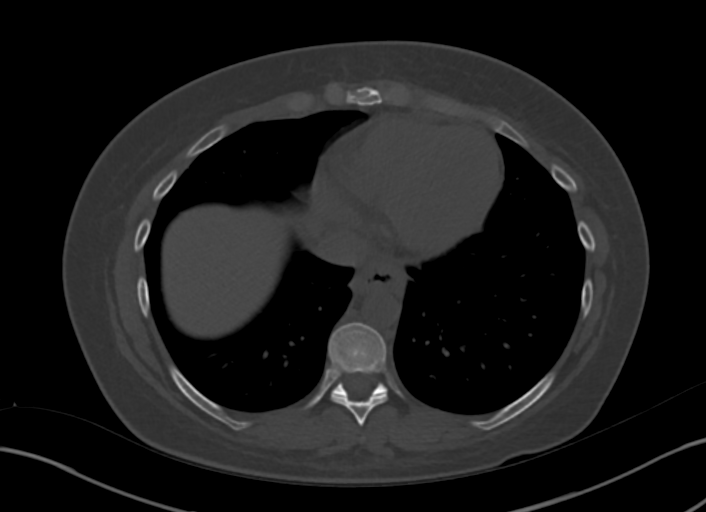

[Series 4: abdomen_pelvis cor 3.00 br40 s3 · coronal · 0.77mm/px · 3 of 95 slices shown]
[im 32/95  soft-tissue]
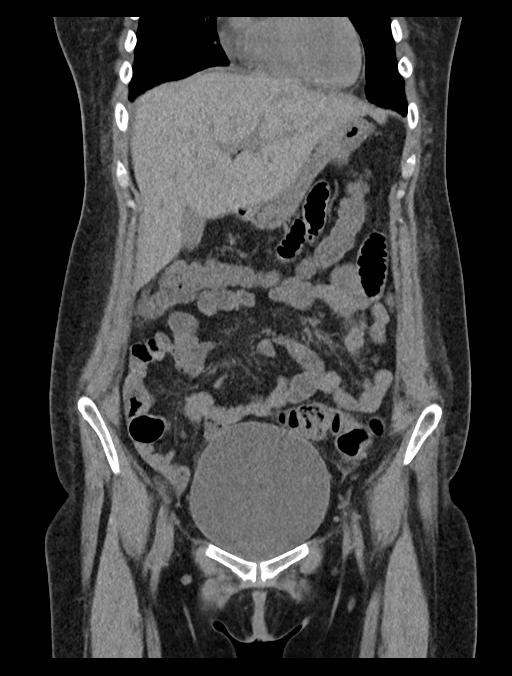
[im 42/95  soft-tissue]
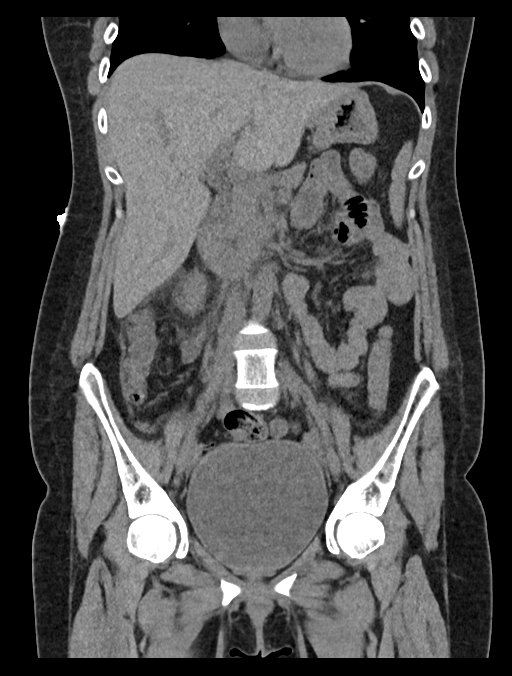
[im 53/95  soft-tissue]
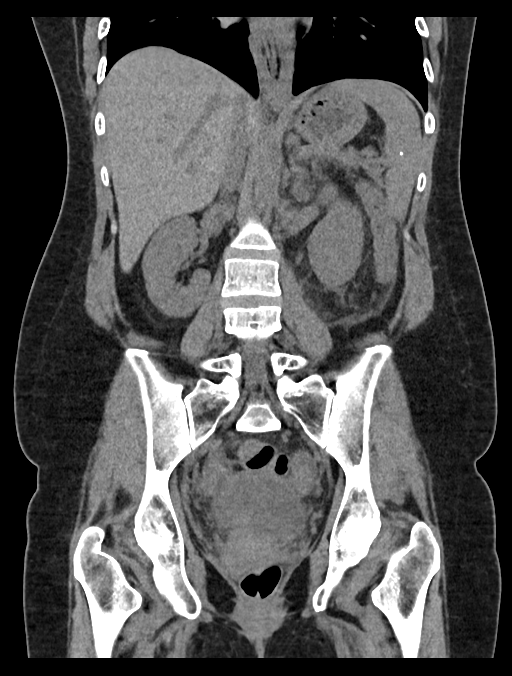

[12 of 46 positions shown; findings below may reference images not displayed]

Ceejay, Paulus N  * Location:

DIAGNOSTIC STUDIES

EXAM

CT Abdomen/Pelvis w/Contrast

INDICATION

Severe nausea and vomiting
abd pain. abscess on bottom onset 3 days ago. nausea and vomiting

TECHNIQUE

CT of abdomen and pelvis was performed with contrast.

All CT scans at this facility use dose modulation, iterative reconstruction, and/or weight based
dosing when appropriate to reduce radiation dose to as low as reasonably achievable.

Number of previous computed tomography exams in the last 12 months is 0  .

Number of previous nuclear medicine myocardial perfusion studies in the last 12 months is 0  .

COMPARISONS

None available at the time of dictation.

FINDINGS

Visualized lower thorax: No significant abnormality.

Liver: Normal size and attenuation.

Spleen: There are calcified granulomas involving the spleen

Gallbladder and biliary system: Normal.

Pancreas: Normal.

Adrenals: Normal.

Kidneys: There is no evidence of hydronephrosis or renal stones. There is no significant ureteral
dilatation.

GI tract: The colon is nondistended. No gross inflammatory changes. The appendix is grossly normal
on this limited noncontrast examination. No significant small bowel dilatation. Stomach is grossly
normal. The there may be mild thickening of the 1st and 2nd portion of the duodenum this is
difficult to confirm on this noncontrast exam. There is mild thickening involving the distal
esophagus.

Lymph nodes and mesentery: No gross adenopathy is identified on this limited noncontrast exam.

Vasculature: Grossly normal on this limited noncontrast exam

Bladder: Normal.

Reproductive organs: Grossly normal for a limited noncontrast examination

Peritoneum: There is a small amount of free fluid in the abdomen and pelvis

Musculoskeletal structures: No significant abnormality.

Other: None.

IMPRESSION
1. Limited examination secondary to lack of IV contrast.
2. Findings suspicious for duodenitis involving the 1st and 2nd portion of the duodenum. No gross
extraluminal air or loculated fluid is identified.
3. Small amount of free fluid in the abdomen and pelvis.
4. Follow-up CT with oral and IV contrast is recommended.

Tech Notes:

abd pain. abscess on bottom onset 3 days ago. nausea and vomiting

## 2023-06-09 ENCOUNTER — Encounter: Admit: 2023-06-09 | Discharge: 2023-06-09 | Payer: Medicaid Other

## 2023-06-17 ENCOUNTER — Encounter: Admit: 2023-06-17 | Discharge: 2023-06-17 | Payer: Medicaid Other

## 2023-06-17 NOTE — Telephone Encounter
RN received incoming CMN from [employer] for Dexcom G7 & Tandem Supplies  RN filled out requested information  Given to Dr. Margy Clarks to sign

## 2023-07-01 ENCOUNTER — Encounter: Admit: 2023-07-01 | Discharge: 2023-07-01 | Payer: Medicaid Other

## 2023-07-03 ENCOUNTER — Encounter: Admit: 2023-07-03 | Discharge: 2023-07-03 | Payer: Medicaid Other

## 2023-07-03 DIAGNOSIS — E1065 Type 1 diabetes mellitus with hyperglycemia: Secondary | ICD-10-CM

## 2023-07-14 ENCOUNTER — Encounter: Admit: 2023-07-14 | Discharge: 2023-07-14 | Payer: Medicaid Other

## 2023-07-14 ENCOUNTER — Ambulatory Visit: Admit: 2023-07-14 | Discharge: 2023-07-15 | Payer: Medicaid Other

## 2023-07-14 DIAGNOSIS — E10649 Type 1 diabetes mellitus with hypoglycemia without coma: Secondary | ICD-10-CM

## 2023-07-14 DIAGNOSIS — E1029 Type 1 diabetes mellitus with other diabetic kidney complication: Secondary | ICD-10-CM

## 2023-07-14 DIAGNOSIS — E10319 Type 1 diabetes mellitus with unspecified diabetic retinopathy without macular edema: Secondary | ICD-10-CM

## 2023-07-14 DIAGNOSIS — E1065 Type 1 diabetes mellitus with hyperglycemia: Secondary | ICD-10-CM

## 2023-07-14 DIAGNOSIS — E119 Type 2 diabetes mellitus without complications: Secondary | ICD-10-CM

## 2023-07-14 NOTE — Patient Instructions
It was great seeing you today!    During your visit we discussed:   - Continue current pump settings  - Try using exercise mode at work   - A1c was 6.2% today!!!      I would like to see you back in 3 months.   Please contact me via MyChart message or telephone call [phone]) if you have any further questions or concerns!    Leandrew Koyanagi, PA-C

## 2023-07-14 NOTE — Progress Notes
Diabetes Clinic Follow up    Subjective:       History of Present Illness  Kristen Raymond is a 32 y.o. female who presents to endocrine clinic for management of her history of type 1 diabetes. She follows with Dr. Verneda Skill for primary care.Last visit with Dr. Margy Clarks in 01/2023. This is her first visit with me.     Hx of T1DM  - dx age 78  - hx of high A1c (10-18%) per her recall  - Complications: Diabetic retinopathy; eye exam in May 2024. She had a spell in vision  - Admits: for grade 3 hypoglycemia Oct 2023, DKA  Hx of severe, urgent hypoglycemia    She's been on Tandem Tslim with control IQ  Since her last visit, her son Sheral Flow) was diagnosed with type 1 diabetes in June 2024.   She endorses increased depression and stress since his diagnosis.   Her family is working to adjust their diets to lower carbohydrate and sugar foods.   She reports increased hyperglycemia. She attributes this to restarting her sugary coffee and increase snacks.   Mostly highs when she forgets to bolus for meal or snack. Pump pauses typically when she's busy at work [employer], [employer]) - not using exercise mode.   Most lows occur during work shifts when she is not eating for prolonged periods of time.     - DM complications assessment:  Diabetic retinopathy, hypoglycemia unawareness  - Risk Factor assessment:   HTN ??- on ACEI/ARB  HLD ? - on statin. Last lipid profile: 2023. No plans for fertility; has tubes tied  ASA - taking    Tandem t:slim pump and Dexcom CGM are downloaded and reviewed by me on the day of the clinic visit  The date ranges between Sept 24, 2024 - Jul 14, 2023  Control IQ is active 97% of the time  Her time CGM use is 100%.    Her average glucose is 146 mg/dL with a standard deviation of 51 and coefficient of variation 35%.  Her GMI is 6.8%  Over the last 4 weeks:  Her time in range is 78%  She is high 15% of the time and very high 4.7% of the time. She has a low 1.9% of the time and very low 0.3% of the time. My interpretation of the ambulatory glucose profile is:  - no more dangerous lows   - pump pauses mostly during busy work day  - prandial hyperglycemia due to missed boluses or delayed boluses.   - using sleep but not exercise mode     Insulin summary  Average total daily dose of insulin is 26.48 units.  Basal is 55%, 14.62 units, bolus is 45%, 11.86 units.  Average daily bolus is 7  Average daily carbs is 133  Her daily average bolus reviewed shows food is 70% of boluses, correction 10%, and control IQ 20%  There are no overrides  She is changing her site and tubing every 3 to 4 days         Review of Systems   Constitutional: Negative.    HENT: Negative.     Eyes: Negative.    Respiratory: Negative.     Cardiovascular: Negative.    Gastrointestinal: Negative.    Endocrine:        Hypoglycemia unawareness   Genitourinary: Negative.    Musculoskeletal: Negative.    Neurological: Negative.    Psychiatric/Behavioral: Negative.     All other systems reviewed and are negative.  Objective:          atorvastatin (LIPITOR) 40 mg tablet Take one tablet by mouth daily.    DEXCOM G7 SENSOR sensor device Use one each as directed every 10 days. Indications: type 2 diabetes mellitus    glucagon (BAQSIMI) 3 mg/actuation nasal spray Use 1 spray in single nostril. If no response, may repeat in 15 minutes using second device.    insulin detemir U-100 (LEVEMIR FLEXPEN) 100 unit/mL (3 mL) injection pen Inject fourteen Units under the skin daily.    insulin lispro (HUMALOG U-100 INSULIN) 100 unit/mL injection Use via insulin pump Max Daily 100 units    insulin pen needles (disposable) (BD UF NANO PEN NEEDLES) 32 gauge x 5/32 pen needle Use 1 Each as directed as Needed. Use with insulin injections.    lancets MISC Use one each as directed three times daily before meals.    lisinopriL (ZESTRIL) 20 mg tablet Take one tablet by mouth daily.    syringe-needle,insulin,0.5 mL (INSULIN SYRINGE MISC) Use  as directed.     Vitals: 07/14/23 0908   BP: (!) (P) 141/91   BP Source: (P) Arm, Left Upper   Pulse: (P) 96   Resp: (P) 18   PainSc: (P) Zero   Weight: (P) 71.2 kg (157 lb)   Height: (P) 156.2 cm (5' 1.5)       Body mass index is 29.18 kg/m? (pended).     Physical Exam  Vitals and nursing note reviewed.   Constitutional:       Appearance: Normal appearance.   HENT:      Head: Normocephalic and atraumatic.   Eyes:      Conjunctiva/sclera: Conjunctivae normal.   Pulmonary:      Effort: Pulmonary effort is normal. No respiratory distress.   Skin:     Coloration: Skin is not jaundiced.   Neurological:      General: No focal deficit present.      Mental Status: She is alert and oriented to person, place, and time.   Psychiatric:         Mood and Affect: Mood normal.         Behavior: Behavior normal.       Lab Draw:  Lab Results   Component Value Date/Time    HGBA1C 18.1 (H) 09/05/2017 05:50 AM    HGBA1C 6.5 (H) 12/13/2015 01:58 PM    HGBA1C 7.7 (H) 06/08/2015 04:28 AM     POC:  Lab Results   Component Value Date/Time    A1C 6.2 (A) 07/14/2023 12:00 AM    A1C 6.3 (A) 02/12/2023 11:28 AM    A1C 6.7 (A) 11/06/2022 12:00 AM     Comprehensive Metabolic Profile    No results found for requested labs within last 30 days.    No results found for requested labs within last 30 days.          No results found for: CHOL, TRIG, HDL, LDL, VLDL, NONHDLCHOL, CHOLHDLC     No results found for: MCALBR, URMALBCRRAT           Assessment and Plan:    Type 1 Diabetes, improved control  Complicated by ?retinopathy and microalbumuria  Hypoglycemia unawareness  Hyperglycemia  A1c has significantly improved and she is now down to 6.2%!  Her time in range is at goal.  Congratulated the patient on her efforts  Complicated by diabetic retinopathy and hypoglycemia unawareness    Plan:   No Change to pump settings; instead,  work toward more accurate/timely bolusing for carbs, and place pump in exercise mode during long work shifts and driving home from work  In case of pump failure - Levemir 14 units  Continue Tandem pump and CGM  Always have a source of glucose on her, keep a source of glucose in her car and at work, and keep her glucagon with her, at work, at home  Meet with diabetes educator   Has back up insulin and unexpired glucagon at home and work  Due for labs, ordered last visit     Regarding CGM prescription:  Patient is knowledgeable and understands how to use device  Patient agrees to connect information to our clinic   The patient will have adequate supervision       Future Appointments   Date Time Provider Department Center   10/14/2023 10:30 AM Lawerance Bach, Danny Lawless, PA-C [facility]            1. Type 1 diabetes mellitus with hyperglycemia (HCC)        2. 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)        3. Type 1 diabetes mellitus with retinopathy, macular edema presence unspecified, unspecified laterality, unspecified retinopathy severity (HCC)        4. 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

## 2023-10-06 ENCOUNTER — Encounter: Admit: 2023-10-06 | Discharge: 2023-10-06 | Payer: MEDICAID

## 2023-10-10 ENCOUNTER — Encounter: Admit: 2023-10-10 | Discharge: 2023-10-10 | Payer: MEDICAID

## 2023-10-10 DIAGNOSIS — E1065 Type 1 diabetes mellitus with hyperglycemia: Secondary | ICD-10-CM

## 2023-10-14 ENCOUNTER — Encounter: Admit: 2023-10-14 | Discharge: 2023-10-14 | Payer: MEDICAID

## 2023-10-14 ENCOUNTER — Ambulatory Visit: Admit: 2023-10-14 | Discharge: 2023-10-14 | Payer: MEDICAID

## 2023-10-14 MED ORDER — LEVEMIR FLEXPEN 100 UNIT/ML (3 ML) SC INPN
14 [IU] | Freq: Every day | SUBCUTANEOUS | 0 refills | Status: AC
Start: 2023-10-14 — End: ?

## 2023-10-14 MED ORDER — DEXCOM G7 SENSOR MISC DEVI
1 | 3 refills | Status: AC
Start: 2023-10-14 — End: ?

## 2023-10-14 NOTE — Progress Notes
Diabetes Clinic Follow up    Subjective:       History of Present Illness  Kristen Raymond is a 33 y.o. female who presents to endocrine clinic for management of her history of type 1 diabetes. She follows with Dr. Verneda Skill for primary care. Last visit with Dr. Margy Clarks in 01/2023 and me in 06/2023.     Hx of T1DM  - dx age 30  - hx of high A1c (10-18%) per her recall  - Complications: Diabetic retinopathy; eye exam in May 2024. She had a spell in vision  - Admits: for grade 3 hypoglycemia Oct 2023, DKA  Hx of severe, urgent hypoglycemia    She's been on Tandem Tslim with control IQ  Her son Kristen Raymond) was diagnosed with type 1 diabetes in June 2024.   She endorses increased depression and stress since his diagnosis.   Her family is working to continue adjusting their diets to lower carbohydrate and sugar foods.   She reports increased hyperglycemia. She attributes this to restarting her sugary coffee and increase snacks.   She struggles with hyperglycemia then hypoglycemia after dosing her her coffee. She's tried adjusting insulin timing, doses, and how quickly she drinks the coffee  Mostly highs when she forgets to bolus for meal or snack. Pump pauses typically when she's busy at work [employer], [employer]) - not using exercise mode.   Most lows occur during work shifts when she is not eating for prolonged periods of time or giving insulin then forgetting to eat     - DM complications assessment:  Diabetic retinopathy, hypoglycemia unawareness  - Risk Factor assessment:   HTN ??- on ACEI/ARB  HLD ? - on statin. Last lipid profile: 2023. No plans for fertility; has tubes tied  ASA - taking    Tandem t:slim pump and Dexcom CGM are downloaded and reviewed by me on the day of the clinic visit  The date ranges between 10/01/23 - 10/14/23  Control IQ is active 95% of the time  Her time CGM use is 98%.    Her average glucose is 151 mg/dL with a standard deviation of 64 and coefficient of variation 42%.  Her GMI is 6.9%  Over the last 4 weeks:  Her time in range is 74%  She is high 15% of the time and very high 7.3% of the time. She has a low 2.6% of the time and very low 0.7% of the time.      My interpretation of the ambulatory glucose profile is:  - no more dangerous lows, though an increase in low occurrences   - pump pauses mostly during busy work day   - prandial hyperglycemia due to missed boluses or delayed boluses.   - postprandial hypoglycemia in the evening due to bolusing and forgetting to eat   - using sleep but not exercise mode     Insulin summary  Average total daily dose of insulin is 29.10 units.  Basal is 51%, 14.76 units, bolus is 49%, 14.34 units.  Average daily bolus is 9  Average daily carbs is 150  Her daily average bolus reviewed shows food is 68% of boluses, correction 11%, and control IQ 22%  There are no overrides   She is changing her site and tubing every 3 to 4 days         Review of Systems   Constitutional: Negative.    HENT: Negative.     Eyes: Negative.    Respiratory: Negative.  Cardiovascular: Negative.    Gastrointestinal: Negative.    Endocrine:        Hypoglycemia unawareness   Genitourinary: Negative.    Musculoskeletal: Negative.    Neurological: Negative.    Psychiatric/Behavioral: Negative.     All other systems reviewed and are negative.        Objective:          atorvastatin (LIPITOR) 40 mg tablet Take one tablet by mouth daily.    DEXCOM G7 SENSOR sensor device Use one each as directed every 10 days. Indications: type 2 diabetes mellitus    glucagon (BAQSIMI) 3 mg/actuation nasal spray Use 1 spray in single nostril. If no response, may repeat in 15 minutes using second device.    insulin detemir U-100 (LEVEMIR FLEXPEN) 100 unit/mL (3 mL) injection pen Inject fourteen Units under the skin daily.    insulin lispro (ADMELOG U-100 INSULIN LISPRO) 100 unit/mL injection use via insulin pump, max 100 units per day    insulin pen needles (disposable) (BD UF NANO PEN NEEDLES) 32 gauge x 5/32 pen needle Use 1 Each as directed as Needed. Use with insulin injections.    lancets MISC Use one each as directed three times daily before meals.    lisinopriL (ZESTRIL) 20 mg tablet Take one tablet by mouth daily.    syringe-needle,insulin,0.5 mL (INSULIN SYRINGE MISC) Use  as directed.     Vitals:    10/14/23 1037   BP: (!) 137/99   BP Source: Arm, Left Upper   Pulse: (P) 96   PainSc: Zero   Weight: 73.2 kg (161 lb 6.4 oz)   Height: 154.9 cm (5' 1)         Body mass index is 30.5 kg/m?Marland Kitchen     Physical Exam  Vitals and nursing note reviewed.   Constitutional:       Appearance: Normal appearance.   HENT:      Head: Normocephalic and atraumatic.   Eyes:      Extraocular Movements: Extraocular movements intact.      Pupils: Pupils are equal, round, and reactive to light.   Pulmonary:      Effort: Pulmonary effort is normal. No respiratory distress.   Skin:     Coloration: Skin is not jaundiced.   Neurological:      General: No focal deficit present.      Mental Status: She is alert and oriented to person, place, and time.   Psychiatric:         Mood and Affect: Mood normal.         Behavior: Behavior normal.       Lab Draw:  Lab Results   Component Value Date/Time    HGBA1C 18.1 (H) 09/05/2017 05:50 AM    HGBA1C 6.5 (H) 12/13/2015 01:58 PM    HGBA1C 7.7 (H) 06/08/2015 04:28 AM     POC:  Lab Results   Component Value Date/Time    A1C 6.5 (A) 10/14/2023 10:49 AM    A1C 6.2 (A) 07/14/2023 12:00 AM    A1C 6.3 (A) 02/12/2023 11:28 AM     Comprehensive Metabolic Profile    No results found for requested labs within last 30 days.    No results found for requested labs within last 30 days.          No results found for: CHOL, TRIG, HDL, LDL, VLDL, NONHDLCHOL, CHOLHDLC     No results found for: MCALBR, URMALBCRRAT  Assessment and Plan:    Type 1 Diabetes, improved control  Complicated by ?retinopathy and microalbumuria  Hypoglycemia unawareness  Hyperglycemia  A1c has significantly improved and stable at 6.5%!  Her time in range is at goal.  Congratulated the patient on her continued efforts  Complicated by diabetic retinopathy and hypoglycemia unawareness    Plan:   No change to pump settings; instead, work toward more accurate/timely bolusing for carbs, and place pump in exercise mode during long work shifts and driving home from work  In case of pump failure - Levemir 14 units  Continue Tandem pump and CGM  Always have a source of glucose on her, keep a source of glucose in her car and at work, and keep her glucagon with her, at work, at home  Meet with diabetes educator   Has back up insulin and unexpired glucagon at home and work  Due for labs, ordered last visit  -provided patient with printed copy of her orders to hand carry to a local lab   She will contact us once her labs have been drawn. We will request the results for my review    Regarding CGM prescription:  Patient is knowledgeable and understands how to use device  Patient agrees to connect information to our clinic   The patient will have adequate supervision       Future Appointments   Date Time Provider Department Center   01/12/2024 11:00 AM Keyonda Bickle, Danny Lawless, PA-C [facility]            1. Type 1 diabetes mellitus with hyperglycemia (HCC)  POC GLUCOSE QUANTITATIVE BLOOD    POC HEMOGLOBIN A1C    INSULIN PUMP DOWNLOAD    LIPID PROFILE    TSH WITH FREE T4 REFLEX    MICROALB/CR RATIO-URINE RANDOM    DEXCOM G7 SENSOR sensor device    insulin detemir U-100 (LEVEMIR FLEXPEN) 100 unit/mL (3 mL) injection pen    COMPREHENSIVE METABOLIC PANEL

## 2023-10-14 NOTE — Patient Instructions
It was great seeing you today!    During your visit we discussed:   - Continue current insulin pump settings  - Try using the work profile we created today to try and prevent low blood sugar at work   - Please have your yearly labs drawn at a hospital closer to home. Let me know where you had these done, and I will request the results.      I would like to see you back in 3 months.   Please contact my nurse Dorene Sorrow via MyChart message or telephone call at [phone] if you have any further questions or concerns!    Leandrew Koyanagi, PA-C

## 2023-10-15 DIAGNOSIS — E1065 Type 1 diabetes mellitus with hyperglycemia: Secondary | ICD-10-CM

## 2023-10-22 LAB — COMPREHENSIVE METABOLIC PANEL
ALBUMIN: 4.2 g/dL (ref 3.6–5.1)
ALK PHOSPHATASE: 92 U/L (ref 31–125)
ALT: 14 U/L (ref 6–29)
AST: 18 U/L (ref 10–30)
BLD UREA NITROGEN: 27 mg/dL (ref 7–25)
CALCIUM: 9.2 mg/dL (ref 8.6–10.2)
CHLORIDE: 103 mmol/L (ref 98–110)
CO2: 23 mmol/L (ref 20–32)
CREATININE: 0.9 mg/dL (ref 0.50–0.97)
GFR ESTIMATED: 81 mL/min/{1.73_m2} (ref >=60)
GLUCOSE,PANEL: 140 mg/dL (ref 65–99)
POTASSIUM: 4.7 mmol/L (ref 3.5–5.3)
SODIUM: 137 mmol/L (ref 135–146)
TOTAL BILIRUBIN: 0.2 mg/dL (ref 0.2–1.2)
TOTAL PROTEIN: 6.9 g/dL (ref 6.1–8.1)

## 2023-10-22 LAB — LIPID PROFILE
CHOLESTEROL/HDL %: 2.2 (ref <5.0)
CHOLESTEROL: 159 mg/dL (ref <200)
HDL: 72 mg/dL (ref >=50)
LDL: 72 mg/dL
NON HDL CHOLESTEROL: 87 mg/dL (ref <130)
TRIGLYCERIDES: 66 mg/dL (ref <150)

## 2023-10-22 LAB — MICROALB/CR RATIO-URINE RANDOM
MICROALBUMIN, RAN: 38 mg/dL
MICROALBUMIN/CR RATIO URINE: 505 mg/g{creat} (ref <30)
UR CREATININE, RAN: 77 mg/dL (ref 20–275)

## 2023-10-22 LAB — TSH WITH FREE T4 REFLEX: TSH: 4.3 m[IU]/L

## 2023-10-24 ENCOUNTER — Encounter: Admit: 2023-10-24 | Discharge: 2023-10-24 | Payer: MEDICAID

## 2023-10-24 DIAGNOSIS — E1065 Type 1 diabetes mellitus with hyperglycemia: Secondary | ICD-10-CM

## 2023-12-04 ENCOUNTER — Encounter: Admit: 2023-12-04 | Discharge: 2023-12-04 | Payer: MEDICAID

## 2024-01-08 ENCOUNTER — Encounter: Admit: 2024-01-08 | Discharge: 2024-01-08

## 2024-01-08 DIAGNOSIS — E1065 Type 1 diabetes mellitus with hyperglycemia: Secondary | ICD-10-CM

## 2024-04-19 ENCOUNTER — Encounter: Admit: 2024-04-19 | Discharge: 2024-04-19 | Payer: MEDICAID

## 2024-04-19 ENCOUNTER — Ambulatory Visit: Admit: 2024-04-19 | Discharge: 2024-04-20 | Payer: MEDICAID

## 2024-09-14 ENCOUNTER — Encounter: Admit: 2024-09-14 | Discharge: 2024-09-14 | Payer: MEDICAID
